# Patient Record
Sex: Male | Born: 1935 | Race: White | Hispanic: No | Marital: Married
Health system: Southern US, Community
[De-identification: ages and names within clinical notes are randomized; demographics above are authoritative.]

---

## 2004-11-30 ENCOUNTER — Inpatient Hospital Stay: Payer: Self-pay | Admitting: Internal Medicine

## 2005-04-16 ENCOUNTER — Ambulatory Visit: Payer: Self-pay | Admitting: Ophthalmology

## 2005-04-22 ENCOUNTER — Ambulatory Visit: Payer: Self-pay | Admitting: Ophthalmology

## 2005-05-21 ENCOUNTER — Ambulatory Visit: Payer: Self-pay | Admitting: Unknown Physician Specialty

## 2005-06-08 ENCOUNTER — Ambulatory Visit: Payer: Self-pay | Admitting: Ophthalmology

## 2005-06-24 ENCOUNTER — Ambulatory Visit: Payer: Self-pay | Admitting: Ophthalmology

## 2005-12-05 ENCOUNTER — Other Ambulatory Visit: Payer: Self-pay

## 2005-12-05 ENCOUNTER — Emergency Department: Payer: Self-pay | Admitting: Emergency Medicine

## 2006-05-27 ENCOUNTER — Ambulatory Visit: Payer: Self-pay | Admitting: Unknown Physician Specialty

## 2006-11-13 ENCOUNTER — Emergency Department: Payer: Self-pay | Admitting: Unknown Physician Specialty

## 2006-11-17 ENCOUNTER — Other Ambulatory Visit: Payer: Self-pay

## 2006-11-17 ENCOUNTER — Inpatient Hospital Stay: Payer: Self-pay | Admitting: Internal Medicine

## 2010-07-19 ENCOUNTER — Inpatient Hospital Stay: Payer: Self-pay | Admitting: Internal Medicine

## 2010-08-01 ENCOUNTER — Inpatient Hospital Stay: Payer: Self-pay | Admitting: Internal Medicine

## 2010-08-19 ENCOUNTER — Inpatient Hospital Stay: Payer: Self-pay | Admitting: *Deleted

## 2010-12-18 ENCOUNTER — Inpatient Hospital Stay: Payer: Self-pay | Admitting: Specialist

## 2011-04-01 IMAGING — CR DG CHEST 1V PORT
1 series · 1 of 1 positions shown · non-contrast
Comparison: none

REASON FOR EXAM: Shortness of Breath
COMMENTS:

[view not recorded]
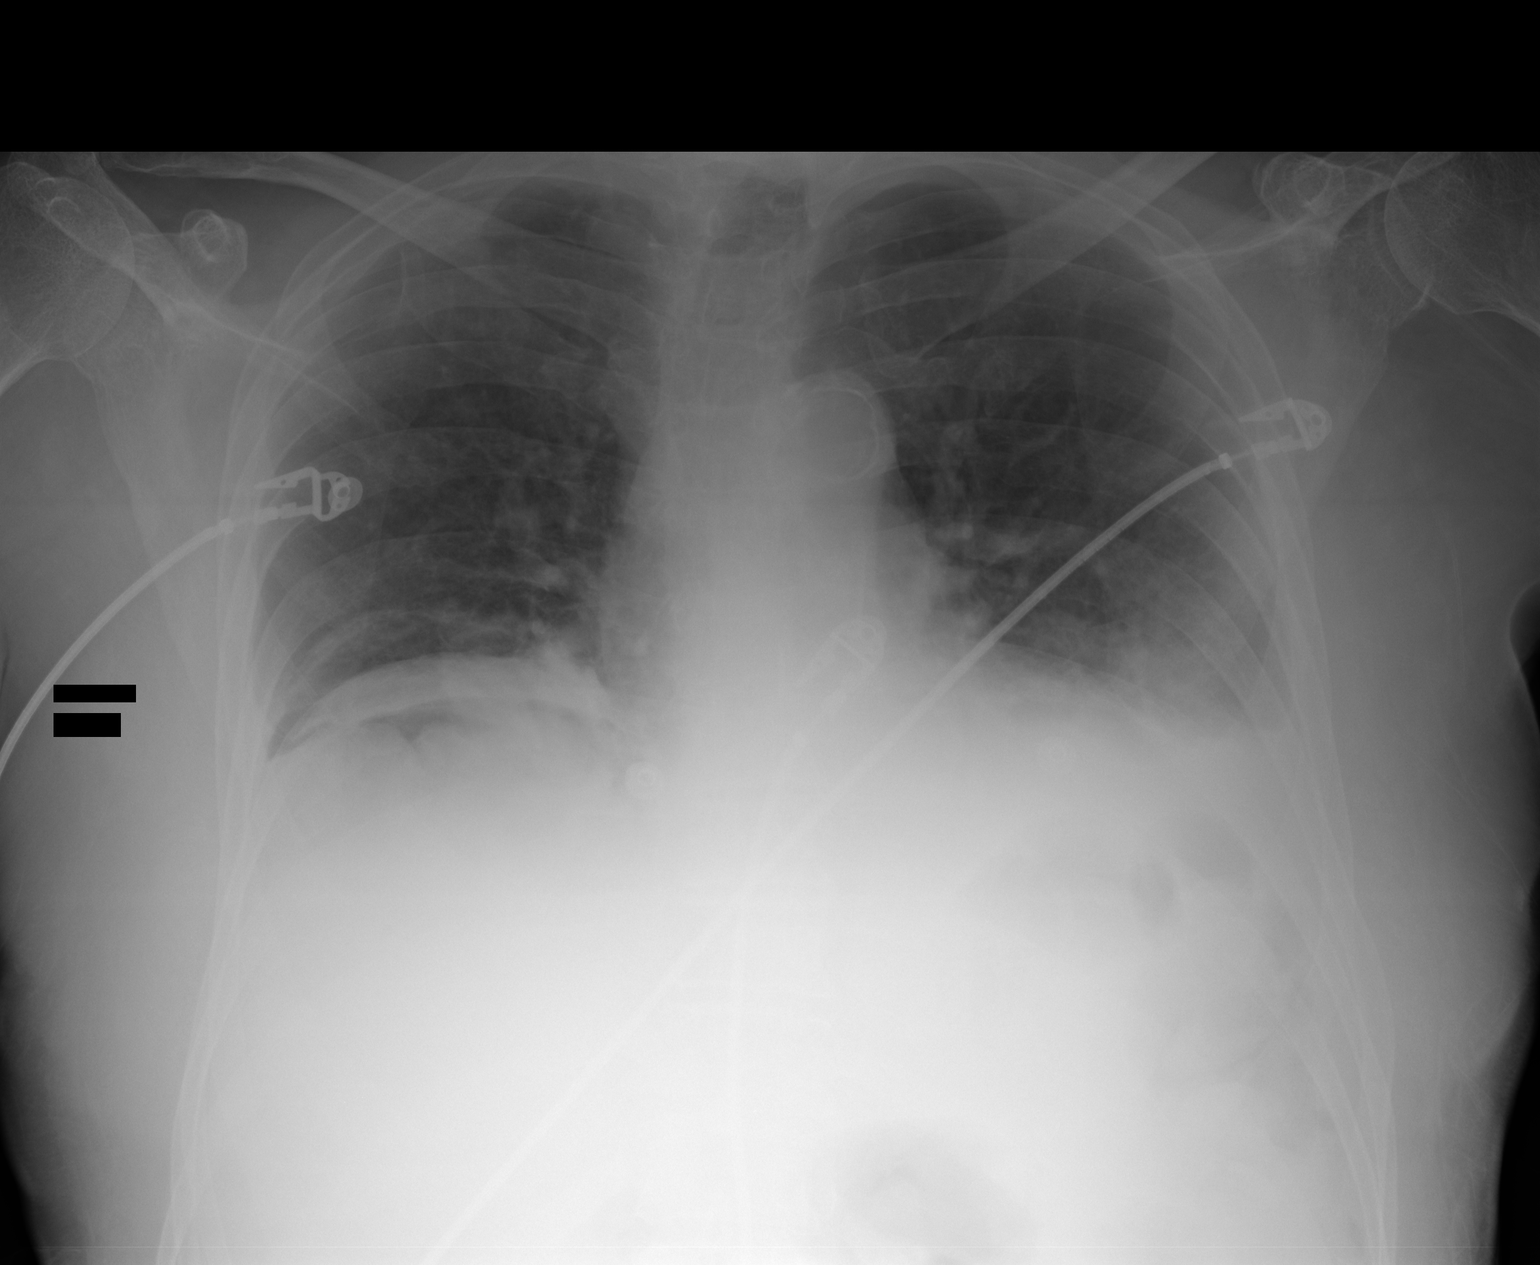

[1 of 1 positions shown; findings below may reference images not displayed]

PROCEDURE:     DXR - DXR PORTABLE CHEST SINGLE VIEW  - August 19, 2010  [DATE]

RESULT:     Portable AP view of the chest is compared to a prior exam of
08/04/2010.

There is observed increased density at the left base compatible with
pneumonia or atelectasis. Follow-up examination including PA and lateral
views is recommended when clinically feasible. The heart is upper limits for
normal in size. No pulmonary edema is seen.
IMPRESSION: There is increased density at the left base compatible with
atelectasis or pneumonia. Follow-up examination including PA and lateral
views is recommended.

## 2011-04-02 IMAGING — CR DG CHEST 2V
1 series · 2 of 2 positions shown · non-contrast
Comparison: none

REASON FOR EXAM: follow up chest film, admitted for increasing sob, chest
xr in er ? PNEUMONIA
COMMENTS:

[Series 1: view not recorded · 0.17mm/px · 2 of 2 slices shown]
[im 1/2]
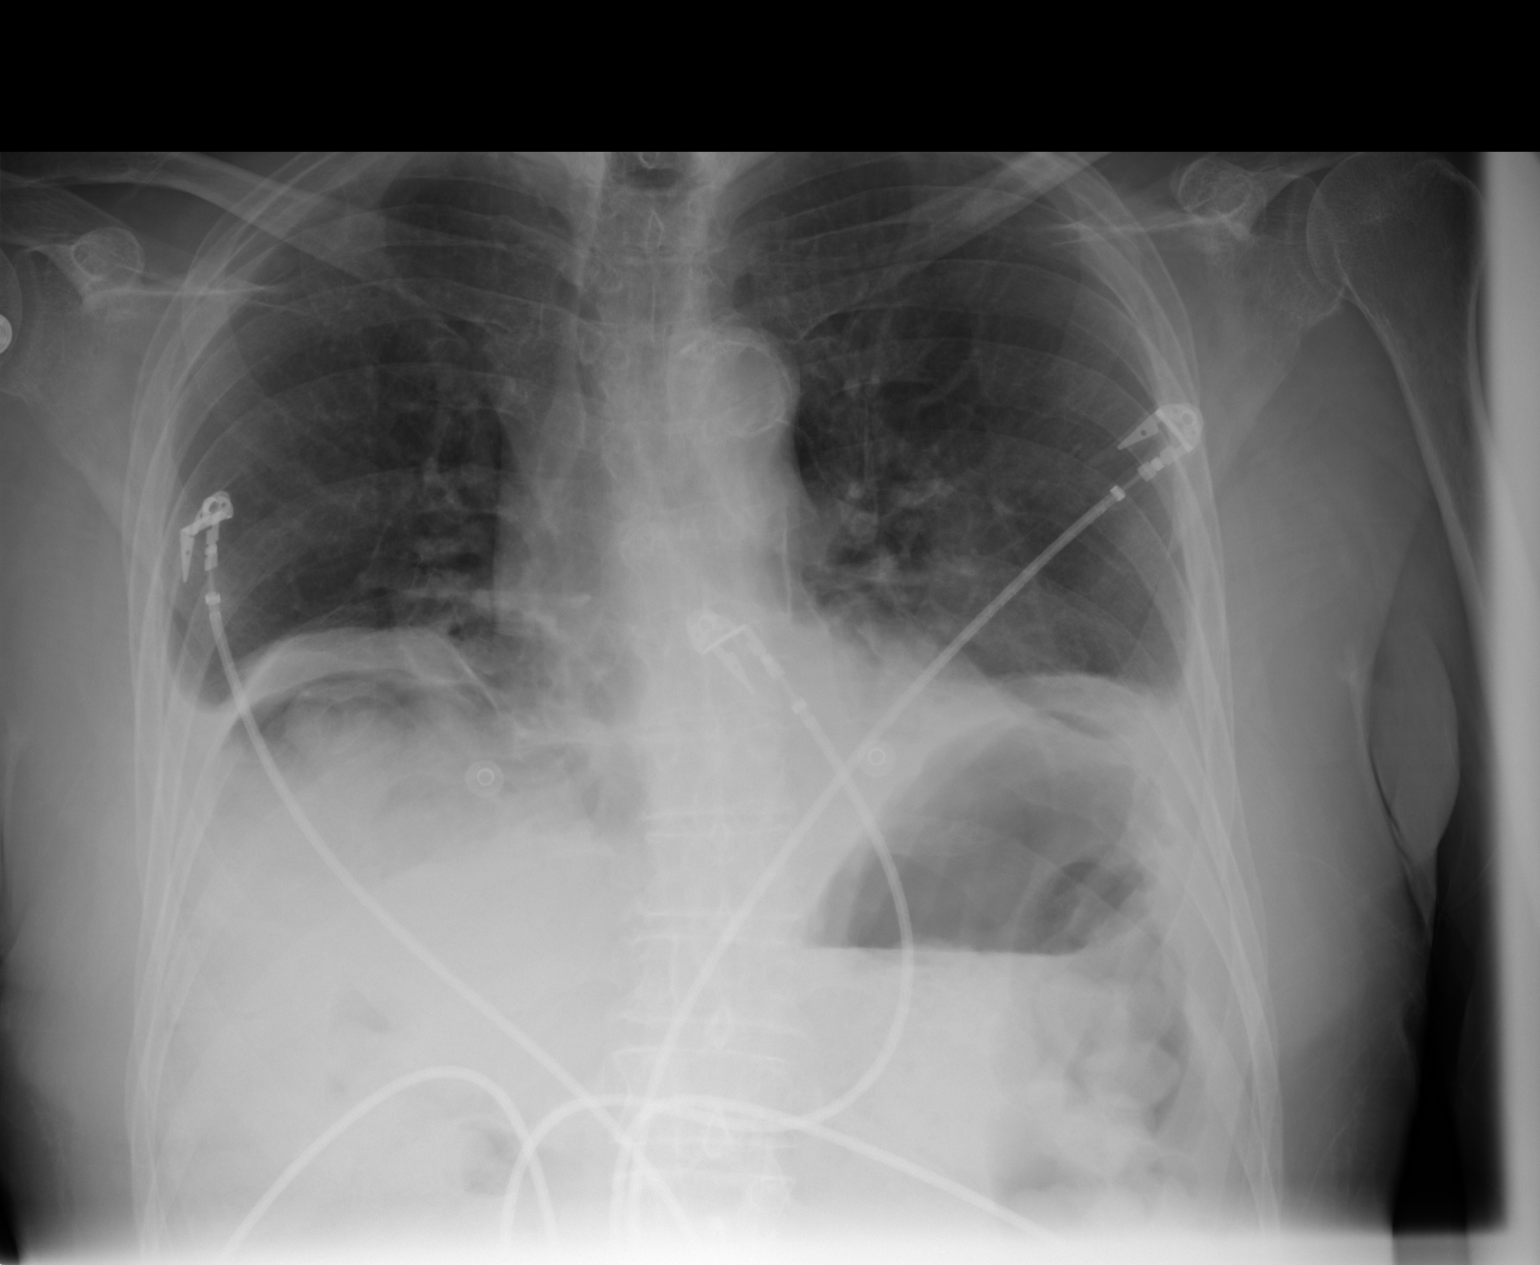
[im 2/2]
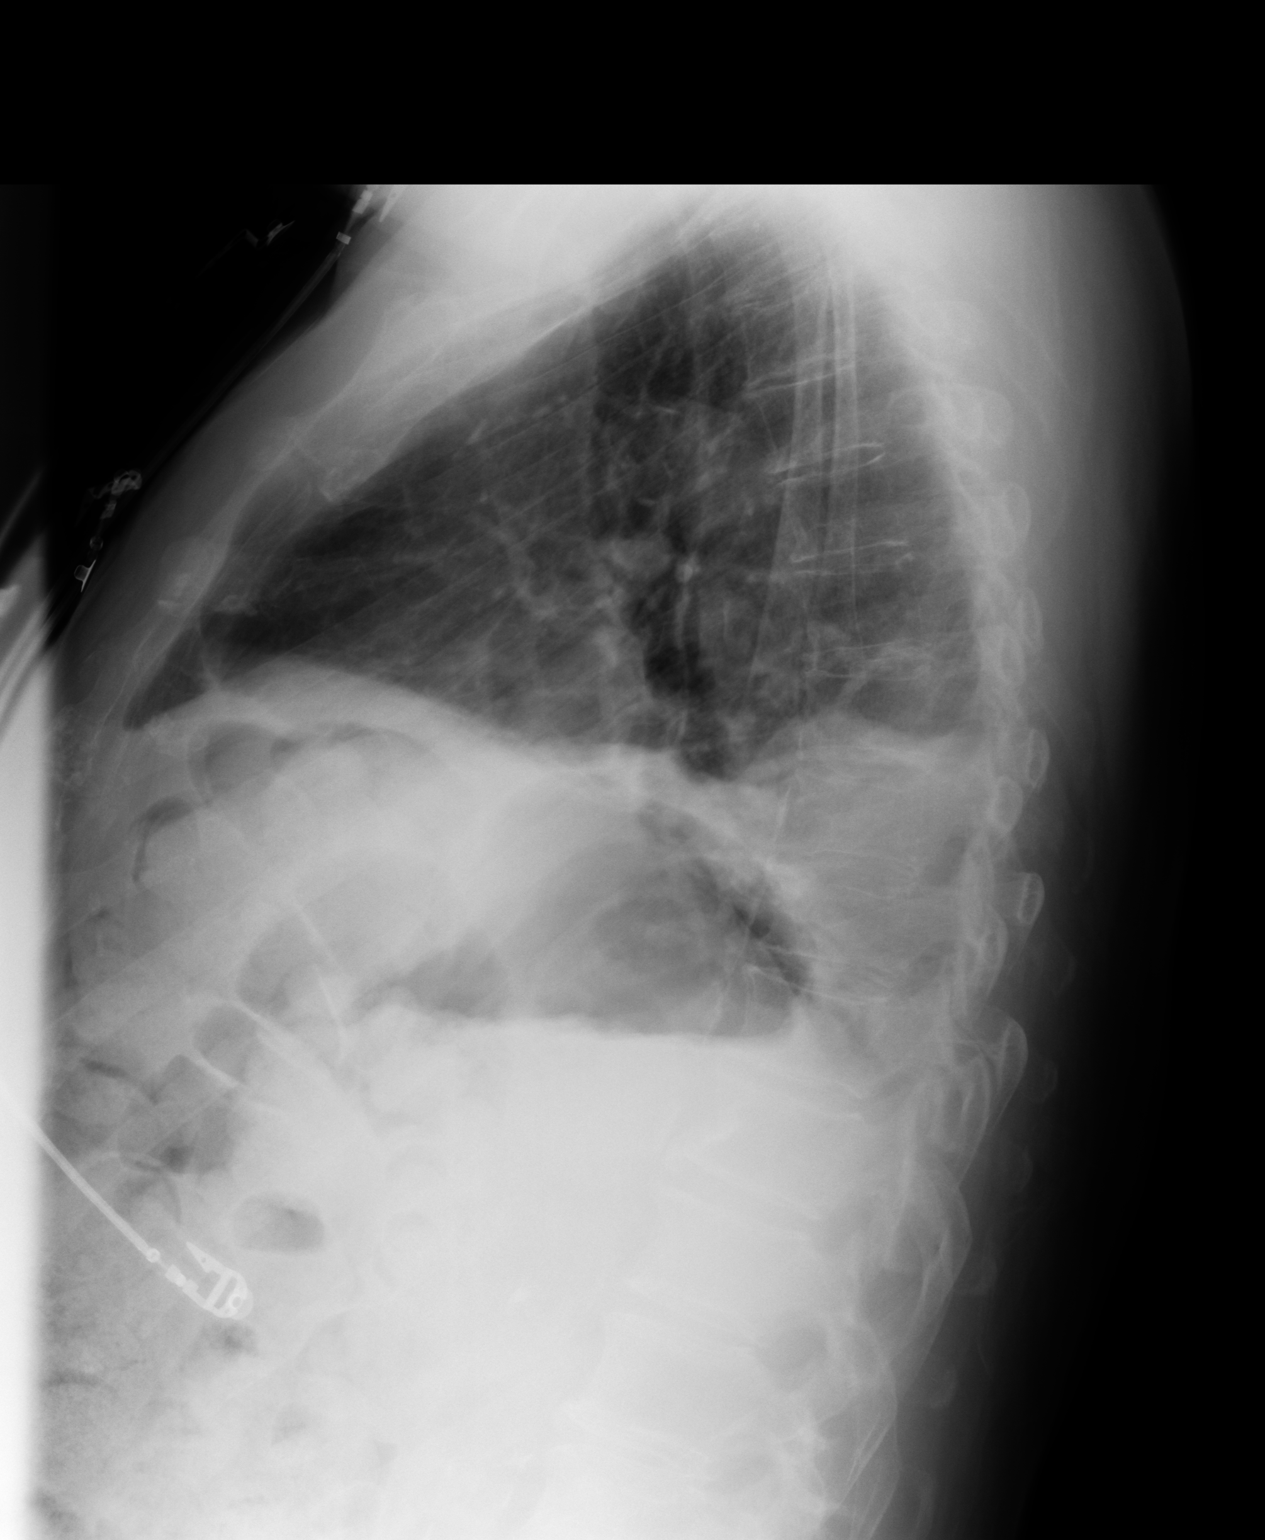

[2 of 2 positions shown; findings below may reference images not displayed]

PROCEDURE:     DXR - DXR CHEST PA (OR AP) AND LATERAL  - August 20, 2010  [DATE]

RESULT:     Comparison is made to the prior exam of 08/19/2010.

The current exam shows increased density posteriorly, apparently on the left
and compatible with pneumonia or effusion. The upper lobes are bilaterally
clear.
IMPRESSION: There is increased density posteriorly at the left base.
Pneumonia would be the first consideration but pleural effusion could
produce similar findings. Continued follow-up is recommended. Note is made
that the changes are best seen in the lateral view.

## 2011-04-19 ENCOUNTER — Inpatient Hospital Stay: Payer: Self-pay | Admitting: Internal Medicine

## 2011-05-16 ENCOUNTER — Inpatient Hospital Stay: Payer: Self-pay | Admitting: Internal Medicine

## 2011-10-13 ENCOUNTER — Inpatient Hospital Stay: Payer: Self-pay | Admitting: Internal Medicine

## 2011-10-13 LAB — CK TOTAL AND CKMB (NOT AT ARMC): CK, Total: 43 U/L (ref 35–232)

## 2011-10-13 LAB — COMPREHENSIVE METABOLIC PANEL
BUN: 60 mg/dL — ABNORMAL HIGH (ref 7–18)
Bilirubin,Total: 0.4 mg/dL (ref 0.2–1.0)
Chloride: 101 mmol/L (ref 98–107)
Creatinine: 3 mg/dL — ABNORMAL HIGH (ref 0.60–1.30)
EGFR (African American): 26 — ABNORMAL LOW
EGFR (Non-African Amer.): 21 — ABNORMAL LOW
Glucose: 136 mg/dL — ABNORMAL HIGH (ref 65–99)
Osmolality: 289 (ref 275–301)
SGPT (ALT): 25 U/L
Total Protein: 6.8 g/dL (ref 6.4–8.2)

## 2011-10-13 LAB — CBC
HGB: 12.1 g/dL — ABNORMAL LOW (ref 13.0–18.0)
MCH: 31.9 pg (ref 26.0–34.0)
RBC: 3.78 10*6/uL — ABNORMAL LOW (ref 4.40–5.90)
WBC: 12.4 10*3/uL — ABNORMAL HIGH (ref 3.8–10.6)

## 2011-10-13 LAB — APTT: Activated PTT: 37.1 secs — ABNORMAL HIGH (ref 23.6–35.9)

## 2011-10-13 LAB — PROTIME-INR: Prothrombin Time: 14.3 secs (ref 11.5–14.7)

## 2011-10-13 LAB — PRO B NATRIURETIC PEPTIDE: B-Type Natriuretic Peptide: 11080 pg/mL — ABNORMAL HIGH (ref 0–450)

## 2011-10-14 LAB — BASIC METABOLIC PANEL
Anion Gap: 10 (ref 7–16)
BUN: 68 mg/dL — ABNORMAL HIGH (ref 7–18)
Chloride: 99 mmol/L (ref 98–107)
Creatinine: 3.08 mg/dL — ABNORMAL HIGH (ref 0.60–1.30)
EGFR (African American): 25 — ABNORMAL LOW
EGFR (Non-African Amer.): 21 — ABNORMAL LOW
Osmolality: 301 (ref 275–301)
Potassium: 4.7 mmol/L (ref 3.5–5.1)
Sodium: 134 mmol/L — ABNORMAL LOW (ref 136–145)

## 2011-10-14 LAB — TROPONIN I
Troponin-I: <0.02 ng/mL
Troponin-I: 2.9 ng/mL — ABNORMAL HIGH

## 2011-10-14 LAB — CK TOTAL AND CKMB (NOT AT ARMC)
CK, Total: 38 U/L (ref 35–232)
CK-MB: 2.2 ng/mL (ref 0.5–3.6)

## 2011-10-14 LAB — APTT
Activated PTT: 36 secs — ABNORMAL HIGH (ref 23.6–35.9)
Activated PTT: >160 secs (ref 23.6–35.9)

## 2011-10-15 LAB — BASIC METABOLIC PANEL
Anion Gap: 11 (ref 7–16)
Calcium, Total: 8.6 mg/dL (ref 8.5–10.1)
Chloride: 98 mmol/L (ref 98–107)
Co2: 26 mmol/L (ref 21–32)
EGFR (African American): 26 — ABNORMAL LOW
EGFR (Non-African Amer.): 22 — ABNORMAL LOW
Glucose: 107 mg/dL — ABNORMAL HIGH (ref 65–99)
Sodium: 135 mmol/L — ABNORMAL LOW (ref 136–145)

## 2011-10-15 LAB — CBC WITH DIFFERENTIAL/PLATELET
Basophil #: 0 10*3/uL (ref 0.0–0.1)
Basophil %: 0.1 %
Eosinophil #: 0 10*3/uL (ref 0.0–0.7)
HCT: 33.8 % — ABNORMAL LOW (ref 40.0–52.0)
Lymphocyte %: 38.6 %
MCH: 31.8 pg (ref 26.0–34.0)
Monocyte %: 4.2 %
Neutrophil #: 5.4 10*3/uL (ref 1.4–6.5)
Neutrophil %: 56.8 %
Platelet: 141 10*3/uL — ABNORMAL LOW (ref 150–440)
RDW: 14.8 % — ABNORMAL HIGH (ref 11.5–14.5)
WBC: 9.5 10*3/uL (ref 3.8–10.6)

## 2011-10-15 LAB — APTT: Activated PTT: 109.6 secs — ABNORMAL HIGH (ref 23.6–35.9)

## 2011-10-16 LAB — BASIC METABOLIC PANEL
Anion Gap: 8 (ref 7–16)
Calcium, Total: 8.4 mg/dL — ABNORMAL LOW (ref 8.5–10.1)
Co2: 29 mmol/L (ref 21–32)
EGFR (African American): 27 — ABNORMAL LOW
EGFR (Non-African Amer.): 22 — ABNORMAL LOW
Glucose: 123 mg/dL — ABNORMAL HIGH (ref 65–99)
Osmolality: 290 (ref 275–301)
Potassium: 4 mmol/L (ref 3.5–5.1)

## 2011-10-16 LAB — URINALYSIS, COMPLETE
Bilirubin,UR: NEGATIVE
Glucose,UR: NEGATIVE mg/dL (ref 0–75)
Ketone: NEGATIVE
Leukocyte Esterase: NEGATIVE
Nitrite: NEGATIVE
Ph: 5 (ref 4.5–8.0)
RBC,UR: 1 /HPF (ref 0–5)
Squamous Epithelial: <1
WBC UR: 2 /HPF (ref 0–5)

## 2011-10-16 LAB — APTT: Activated PTT: 37.1 secs — ABNORMAL HIGH (ref 23.6–35.9)

## 2011-10-17 LAB — BASIC METABOLIC PANEL
Calcium, Total: 8.5 mg/dL (ref 8.5–10.1)
Chloride: 95 mmol/L — ABNORMAL LOW (ref 98–107)
EGFR (African American): 26 — ABNORMAL LOW
EGFR (Non-African Amer.): 22 — ABNORMAL LOW
Glucose: 181 mg/dL — ABNORMAL HIGH (ref 65–99)
Osmolality: 298 (ref 275–301)
Potassium: 4.3 mmol/L (ref 3.5–5.1)
Sodium: 134 mmol/L — ABNORMAL LOW (ref 136–145)

## 2011-10-19 LAB — CBC WITH DIFFERENTIAL/PLATELET
Basophil %: 0.1 %
Eosinophil %: 0.1 %
HCT: 37.9 % — ABNORMAL LOW (ref 40.0–52.0)
HGB: 12.2 g/dL — ABNORMAL LOW (ref 13.0–18.0)
Lymphocyte %: 40.4 %
MCH: 31.6 pg (ref 26.0–34.0)
Monocyte #: 0.4 10*3/uL (ref 0.0–0.7)
Monocyte %: 3.3 %
Neutrophil #: 7.7 10*3/uL — ABNORMAL HIGH (ref 1.4–6.5)
Neutrophil %: 56.1 %
Platelet: 190 10*3/uL (ref 150–440)
RBC: 3.88 10*6/uL — ABNORMAL LOW (ref 4.40–5.90)
WBC: 13.8 10*3/uL — ABNORMAL HIGH (ref 3.8–10.6)

## 2011-10-19 LAB — BASIC METABOLIC PANEL
BUN: 110 mg/dL — ABNORMAL HIGH (ref 7–18)
Calcium, Total: 8.9 mg/dL (ref 8.5–10.1)
Chloride: 96 mmol/L — ABNORMAL LOW (ref 98–107)
Co2: 32 mmol/L (ref 21–32)
Creatinine: 3.09 mg/dL — ABNORMAL HIGH (ref 0.60–1.30)
EGFR (African American): 25 — ABNORMAL LOW
EGFR (Non-African Amer.): 21 — ABNORMAL LOW
Glucose: 158 mg/dL — ABNORMAL HIGH (ref 65–99)
Sodium: 137 mmol/L (ref 136–145)

## 2011-10-20 LAB — BASIC METABOLIC PANEL
Anion Gap: 10 (ref 7–16)
Co2: 33 mmol/L — ABNORMAL HIGH (ref 21–32)
Creatinine: 3.18 mg/dL — ABNORMAL HIGH (ref 0.60–1.30)
EGFR (African American): 24 — ABNORMAL LOW
Osmolality: 315 (ref 275–301)
Sodium: 138 mmol/L (ref 136–145)

## 2011-10-21 LAB — BASIC METABOLIC PANEL
Anion Gap: 9 (ref 7–16)
BUN: 115 mg/dL — ABNORMAL HIGH (ref 7–18)
Calcium, Total: 9 mg/dL (ref 8.5–10.1)
Chloride: 96 mmol/L — ABNORMAL LOW (ref 98–107)
Creatinine: 2.79 mg/dL — ABNORMAL HIGH (ref 0.60–1.30)
EGFR (African American): 28 — ABNORMAL LOW
EGFR (Non-African Amer.): 23 — ABNORMAL LOW
Glucose: 119 mg/dL — ABNORMAL HIGH (ref 65–99)
Potassium: 4.1 mmol/L (ref 3.5–5.1)
Sodium: 138 mmol/L (ref 136–145)

## 2011-10-22 LAB — CREATININE, SERUM
Creatinine: 2.82 mg/dL — ABNORMAL HIGH
EGFR (African American): 28 — ABNORMAL LOW
EGFR (Non-African Amer.): 23 — ABNORMAL LOW

## 2011-12-27 IMAGING — CR DG CHEST 1V PORT
1 series · 1 of 1 positions shown · non-contrast
Comparison: none

REASON FOR EXAM: Fever, SOB
COMMENTS:

PROCEDURE:     DXR - DXR PORTABLE CHEST SINGLE VIEW  - May 16, 2011  [DATE]
RESULT:     Comparison: 12/21/2010

[view not recorded]
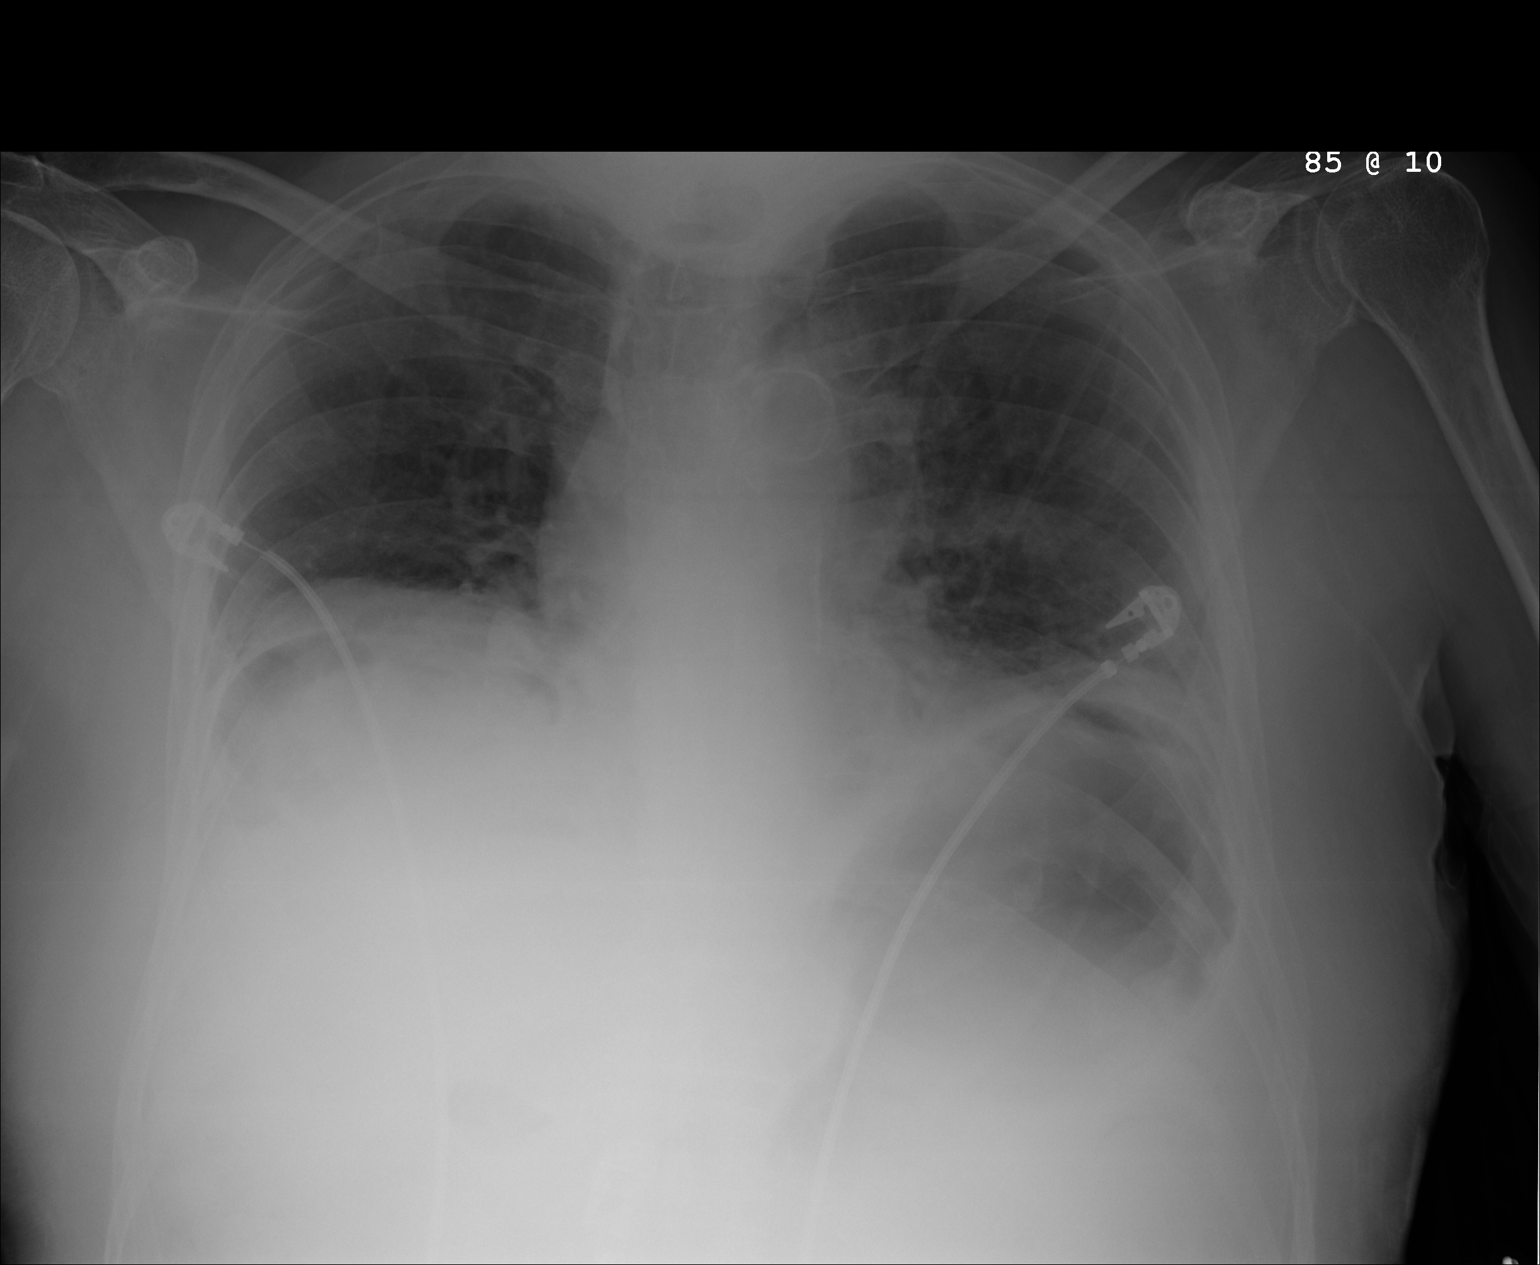

[1 of 1 positions shown; findings below may reference images not displayed]

FINDINGS: Heart and mediastinum are stable, given the low lung volumes. There are
bibasilar opacities. Lucency overlying the right hemidiaphragm is similar to
prior and felt to be secondary to the overlying colon. There is mild
prominence of the pulmonary interstitium.
IMPRESSION: 1. Bibasilar heterogeneous opacities may represent atelectasis secondary to
the low lung volumes. However, infection cannot be excluded.
2. Interstitial opacities may represent interstitial pulmonary edema.
Atypical infection could have a similar appearance.

## 2012-03-07 ENCOUNTER — Ambulatory Visit: Payer: Self-pay | Admitting: Internal Medicine

## 2012-04-01 ENCOUNTER — Inpatient Hospital Stay: Payer: Self-pay | Admitting: Internal Medicine

## 2012-04-01 LAB — COMPREHENSIVE METABOLIC PANEL
Albumin: 3.7 g/dL (ref 3.4–5.0)
Anion Gap: 12 (ref 7–16)
Calcium, Total: 8.7 mg/dL (ref 8.5–10.1)
Chloride: 108 mmol/L — ABNORMAL HIGH (ref 98–107)
EGFR (African American): 14 — ABNORMAL LOW
EGFR (Non-African Amer.): 12 — ABNORMAL LOW
Glucose: 72 mg/dL (ref 65–99)
Osmolality: 302 (ref 275–301)
Potassium: 4.4 mmol/L (ref 3.5–5.1)
SGOT(AST): 15 U/L (ref 15–37)
SGPT (ALT): 19 U/L
Sodium: 138 mmol/L (ref 136–145)

## 2012-04-01 LAB — URINALYSIS, COMPLETE
Bacteria: NONE SEEN
Bilirubin,UR: NEGATIVE
Glucose,UR: NEGATIVE mg/dL (ref 0–75)
Leukocyte Esterase: NEGATIVE
RBC,UR: <1 /HPF (ref 0–5)
Squamous Epithelial: NONE SEEN

## 2012-04-01 LAB — CBC WITH DIFFERENTIAL/PLATELET
Basophil %: 0.4 %
Eosinophil %: 1.9 %
HGB: 12.3 g/dL — ABNORMAL LOW (ref 13.0–18.0)
Lymphocyte #: 2.6 10*3/uL (ref 1.0–3.6)
Lymphocyte %: 22.1 %
MCV: 98 fL (ref 80–100)
Monocyte %: 7.3 %
Neutrophil %: 68.3 %
RBC: 3.84 10*6/uL — ABNORMAL LOW (ref 4.40–5.90)
RDW: 16.1 % — ABNORMAL HIGH (ref 11.5–14.5)

## 2012-04-01 LAB — PHOSPHORUS: Phosphorus: 7.5 mg/dL — ABNORMAL HIGH (ref 2.5–4.9)

## 2012-04-01 LAB — CK TOTAL AND CKMB (NOT AT ARMC)
CK, Total: 35 U/L (ref 35–232)
CK-MB: 4.4 ng/mL — ABNORMAL HIGH (ref 0.5–3.6)

## 2012-04-01 LAB — PRO B NATRIURETIC PEPTIDE: B-Type Natriuretic Peptide: 41354 pg/mL — ABNORMAL HIGH (ref 0–450)

## 2012-04-02 LAB — BASIC METABOLIC PANEL
Anion Gap: 16 (ref 7–16)
BUN: 95 mg/dL — ABNORMAL HIGH (ref 7–18)
Calcium, Total: 8.3 mg/dL — ABNORMAL LOW (ref 8.5–10.1)
Creatinine: 4.35 mg/dL — ABNORMAL HIGH (ref 0.60–1.30)
EGFR (African American): 13 — ABNORMAL LOW
EGFR (Non-African Amer.): 12 — ABNORMAL LOW
Glucose: 139 mg/dL — ABNORMAL HIGH (ref 65–99)
Osmolality: 309 (ref 275–301)
Potassium: 4.7 mmol/L (ref 3.5–5.1)

## 2012-04-02 LAB — CBC WITH DIFFERENTIAL/PLATELET
Basophil #: 0 10*3/uL (ref 0.0–0.1)
Eosinophil #: 0 10*3/uL (ref 0.0–0.7)
HCT: 34.9 % — ABNORMAL LOW (ref 40.0–52.0)
HGB: 11.3 g/dL — ABNORMAL LOW (ref 13.0–18.0)
Lymphocyte #: 2.3 10*3/uL (ref 1.0–3.6)
Lymphocyte %: 26.3 %
MCHC: 32.2 g/dL (ref 32.0–36.0)
Monocyte %: 1.6 %
Neutrophil #: 6.4 10*3/uL (ref 1.4–6.5)
Platelet: 119 10*3/uL — ABNORMAL LOW (ref 150–440)
RDW: 16 % — ABNORMAL HIGH (ref 11.5–14.5)
WBC: 8.9 10*3/uL (ref 3.8–10.6)

## 2012-04-02 LAB — PHOSPHORUS: Phosphorus: 8.1 mg/dL — ABNORMAL HIGH (ref 2.5–4.9)

## 2012-04-03 LAB — CBC WITH DIFFERENTIAL/PLATELET
Basophil #: 0.1 10*3/uL (ref 0.0–0.1)
Basophil %: 0.9 %
Eosinophil #: 0.1 10*3/uL (ref 0.0–0.7)
Lymphocyte %: 22.9 %
MCHC: 32.9 g/dL (ref 32.0–36.0)
Monocyte #: 0.8 x10 3/mm (ref 0.2–1.0)
Monocyte %: 7.8 %
Neutrophil %: 67.5 %
WBC: 10.8 10*3/uL — ABNORMAL HIGH (ref 3.8–10.6)

## 2012-04-03 LAB — PROTEIN / CREATININE RATIO, URINE
Creatinine, Urine: 41 mg/dL (ref 30.0–125.0)
Protein, Random Urine: 45 mg/dL — ABNORMAL HIGH (ref 0–12)
Protein/Creat. Ratio: 1098 mg/gCREAT — ABNORMAL HIGH (ref 0–200)

## 2012-04-03 LAB — BASIC METABOLIC PANEL
Anion Gap: 13 (ref 7–16)
BUN: 106 mg/dL — ABNORMAL HIGH (ref 7–18)
Creatinine: 4.61 mg/dL — ABNORMAL HIGH (ref 0.60–1.30)
EGFR (Non-African Amer.): 11 — ABNORMAL LOW
Glucose: 134 mg/dL — ABNORMAL HIGH (ref 65–99)
Osmolality: 307 (ref 275–301)

## 2012-04-04 LAB — CBC WITH DIFFERENTIAL/PLATELET
Basophil #: 0 10*3/uL (ref 0.0–0.1)
Basophil %: 0.1 %
Eosinophil #: 0.2 10*3/uL (ref 0.0–0.7)
HCT: 35.7 % — ABNORMAL LOW (ref 40.0–52.0)
HGB: 11.5 g/dL — ABNORMAL LOW (ref 13.0–18.0)
Lymphocyte #: 2.9 10*3/uL (ref 1.0–3.6)
MCH: 31.8 pg (ref 26.0–34.0)
MCHC: 32.4 g/dL (ref 32.0–36.0)
Monocyte #: 0.9 x10 3/mm (ref 0.2–1.0)
Neutrophil #: 9.8 10*3/uL — ABNORMAL HIGH (ref 1.4–6.5)
Neutrophil %: 70.7 %
RBC: 3.63 10*6/uL — ABNORMAL LOW (ref 4.40–5.90)
RDW: 16.4 % — ABNORMAL HIGH (ref 11.5–14.5)

## 2012-04-04 LAB — CREATININE CLEARANCE, URINE, 24 HOUR
Creatinine Clearance: 5 mL/min — ABNORMAL LOW (ref 80–130)
Creatinine, Serum: 4.68 mg/dL — ABNORMAL HIGH (ref 0.50–1.20)

## 2012-04-04 LAB — RENAL FUNCTION PANEL
Albumin: 3.3 g/dL — ABNORMAL LOW (ref 3.4–5.0)
Anion Gap: 16 (ref 7–16)
BUN: 106 mg/dL — ABNORMAL HIGH (ref 7–18)
Calcium, Total: 7.8 mg/dL — ABNORMAL LOW (ref 8.5–10.1)
Creatinine: 4.68 mg/dL — ABNORMAL HIGH (ref 0.60–1.30)
Glucose: 128 mg/dL — ABNORMAL HIGH (ref 65–99)
Osmolality: 307 (ref 275–301)
Sodium: 136 mmol/L (ref 136–145)

## 2012-04-05 LAB — BASIC METABOLIC PANEL
Anion Gap: 15 (ref 7–16)
BUN: 118 mg/dL — ABNORMAL HIGH (ref 7–18)
Chloride: 104 mmol/L (ref 98–107)
Creatinine: 5.22 mg/dL — ABNORMAL HIGH (ref 0.60–1.30)
EGFR (African American): 11 — ABNORMAL LOW
EGFR (Non-African Amer.): 9 — ABNORMAL LOW
Glucose: 108 mg/dL — ABNORMAL HIGH (ref 65–99)
Osmolality: 305 (ref 275–301)
Potassium: 5 mmol/L (ref 3.5–5.1)

## 2012-04-05 LAB — CBC WITH DIFFERENTIAL/PLATELET
Basophil #: 0 10*3/uL (ref 0.0–0.1)
Eosinophil #: 0.2 10*3/uL (ref 0.0–0.7)
Eosinophil %: 1.6 %
HGB: 11.3 g/dL — ABNORMAL LOW (ref 13.0–18.0)
Lymphocyte #: 2.2 10*3/uL (ref 1.0–3.6)
MCH: 32.6 pg (ref 26.0–34.0)
MCHC: 33.3 g/dL (ref 32.0–36.0)
MCV: 98 fL (ref 80–100)
Monocyte #: 0.9 x10 3/mm (ref 0.2–1.0)
Monocyte %: 7.7 %
Neutrophil %: 70.8 %
Platelet: 137 10*3/uL — ABNORMAL LOW (ref 150–440)
RBC: 3.47 10*6/uL — ABNORMAL LOW (ref 4.40–5.90)
RDW: 16.1 % — ABNORMAL HIGH (ref 11.5–14.5)

## 2012-04-05 LAB — UR PROT ELECTROPHORESIS, URINE RANDOM

## 2012-04-06 LAB — BASIC METABOLIC PANEL
Creatinine: 5.7 mg/dL — ABNORMAL HIGH (ref 0.60–1.30)
EGFR (African American): 10 — ABNORMAL LOW
EGFR (Non-African Amer.): 8 — ABNORMAL LOW
Glucose: 120 mg/dL — ABNORMAL HIGH (ref 65–99)
Potassium: 5.3 mmol/L — ABNORMAL HIGH (ref 3.5–5.1)
Sodium: 134 mmol/L — ABNORMAL LOW (ref 136–145)

## 2012-04-06 LAB — CBC WITH DIFFERENTIAL/PLATELET
Basophil #: 0 10*3/uL (ref 0.0–0.1)
Basophil %: 0.2 %
Eosinophil #: 0.1 10*3/uL (ref 0.0–0.7)
HCT: 34.6 % — ABNORMAL LOW (ref 40.0–52.0)
HGB: 11.3 g/dL — ABNORMAL LOW (ref 13.0–18.0)
Lymphocyte %: 23.7 %
MCH: 32.4 pg (ref 26.0–34.0)
MCHC: 32.7 g/dL (ref 32.0–36.0)
MCV: 99 fL (ref 80–100)
Neutrophil %: 67.3 %
Platelet: 128 10*3/uL — ABNORMAL LOW (ref 150–440)
RBC: 3.49 10*6/uL — ABNORMAL LOW (ref 4.40–5.90)

## 2012-04-07 ENCOUNTER — Ambulatory Visit: Payer: Self-pay | Admitting: Internal Medicine

## 2012-05-08 DEATH — deceased

## 2012-11-13 IMAGING — US US RENAL KIDNEY
1 series · 14 of 25 positions shown · non-contrast
Comparison: none

REASON FOR EXAM: acute renal failure, CKD stage IV
COMMENTS:

[Series 1: us renal kidney · 0.28mm/px · 14 of 45 slices shown]
[im 1/45]
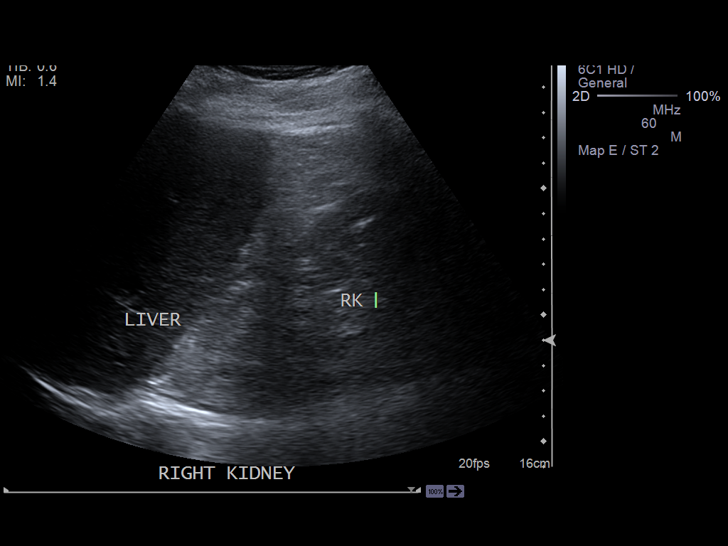
[im 4/45]
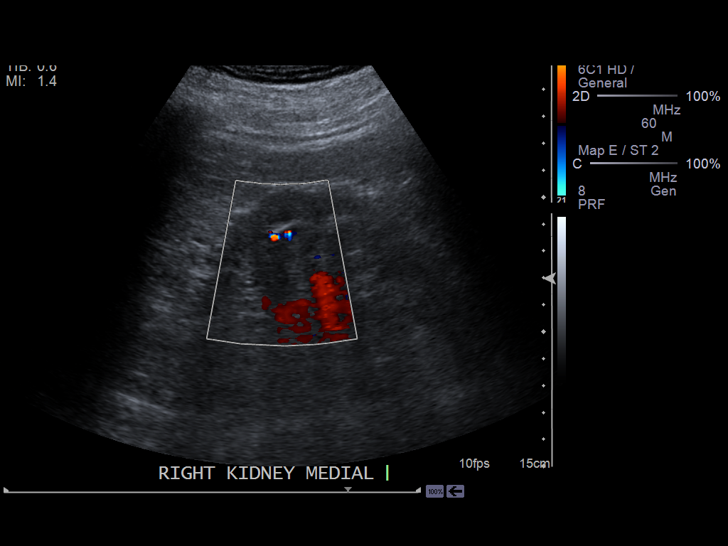
[im 8/45]
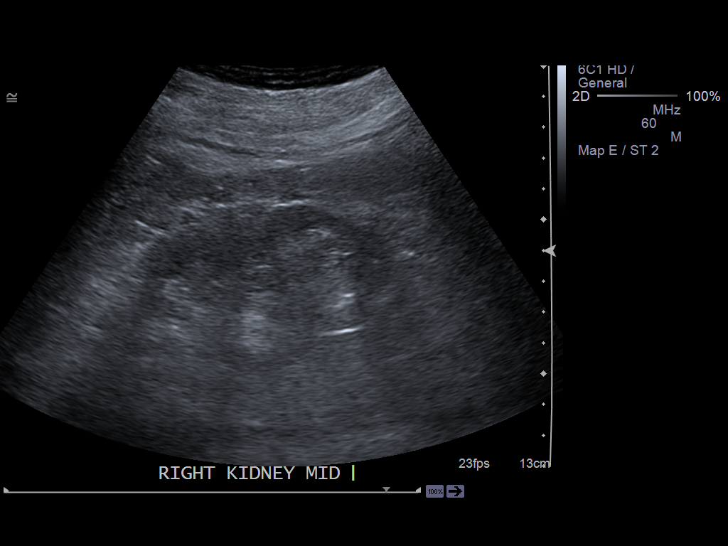
[im 12/45]
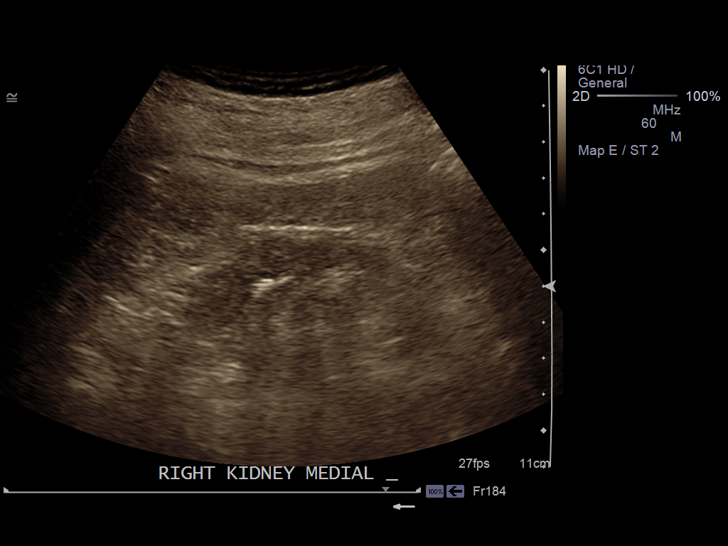
[im 15/45]
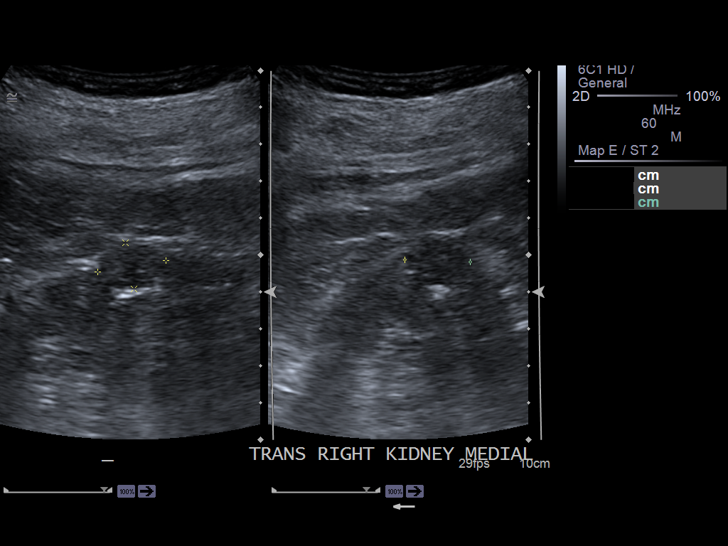
[im 17/45]
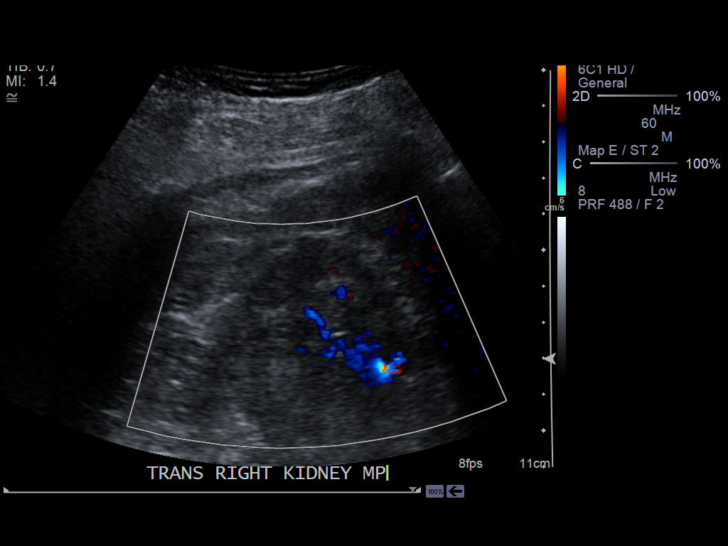
[im 21/45]
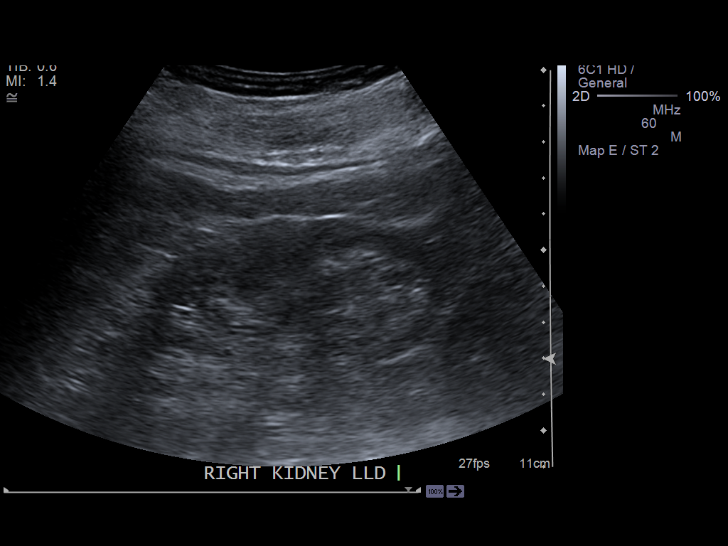
[im 24/45]
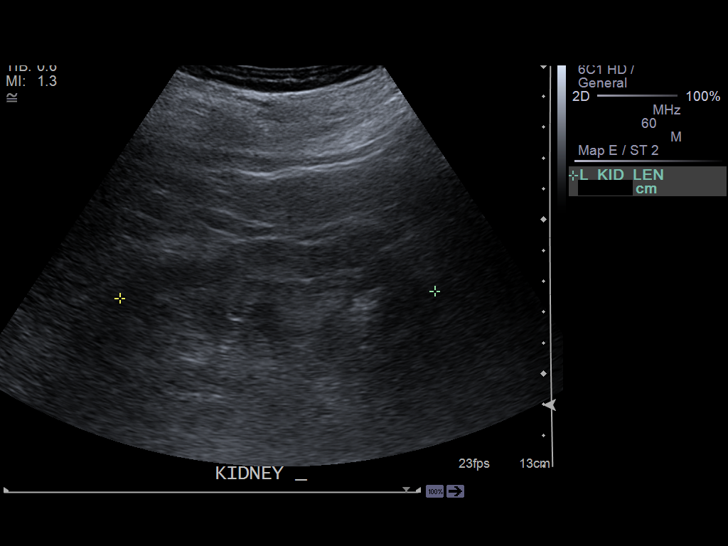
[im 28/45]
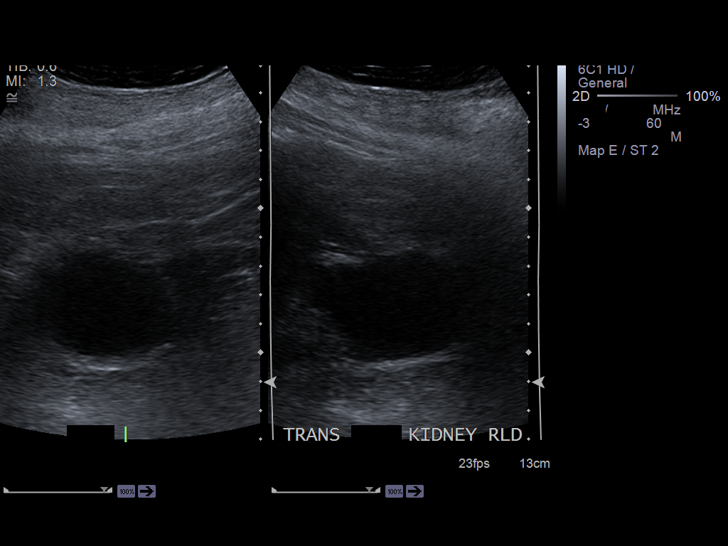
[im 30/45]
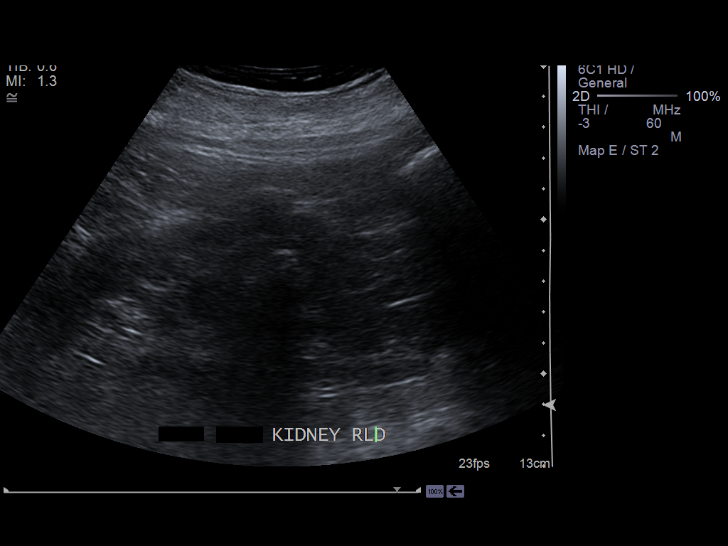
[im 34/45]
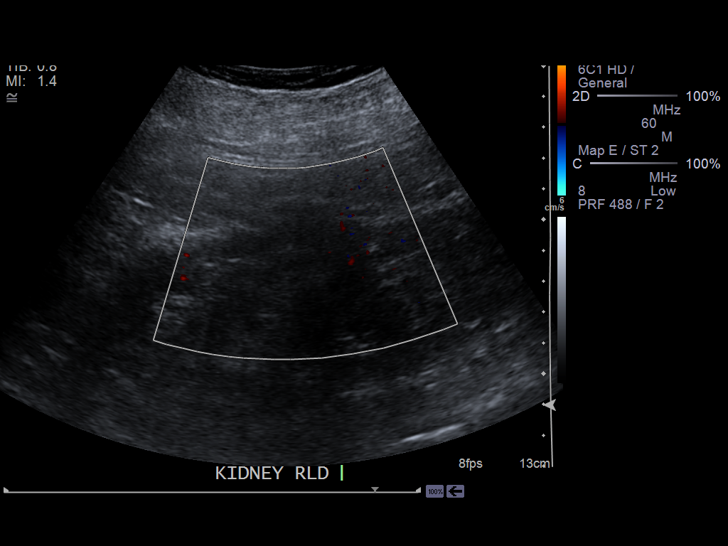
[im 37/45]
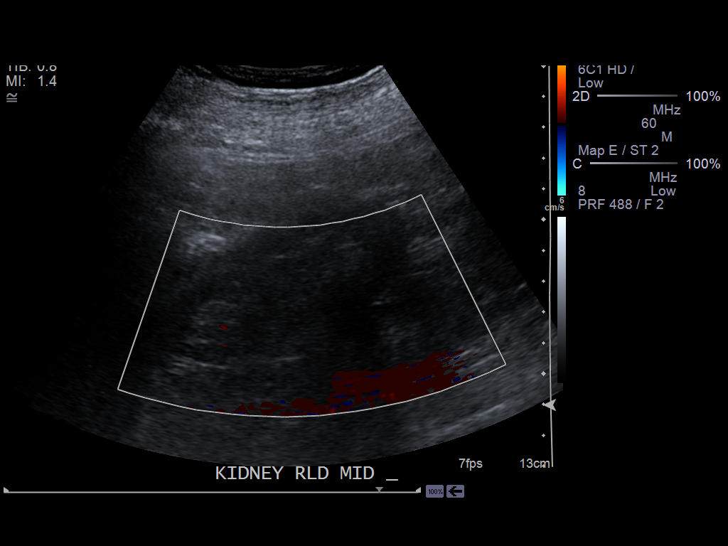
[im 41/45]
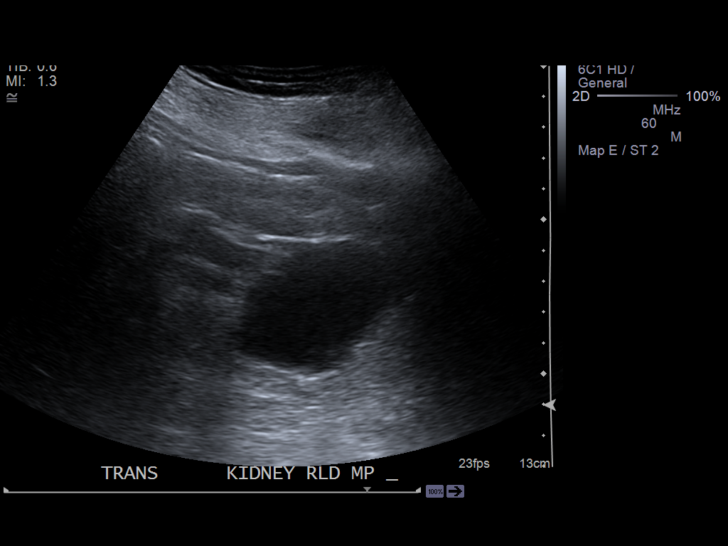
[im 45/45]
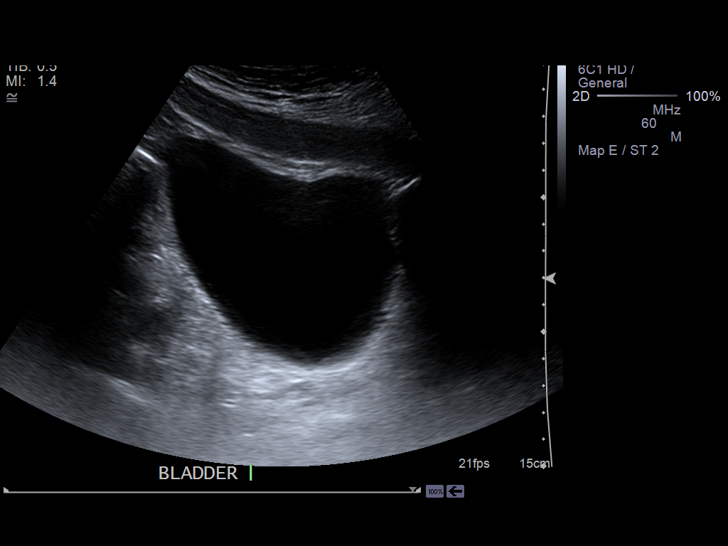

[14 of 25 positions shown; findings below may reference images not displayed]

PROCEDURE:     US  - US KIDNEY  - April 02, 2012  [DATE]

RESULT:     The right kidney measures 9.5 x 5.6 x 4.6 cm. The left kidney
measures 10.2 x 4.4 x 6.6 cm. Neither kidney exhibits evidence of
obstruction. The echotexture of the renal cortex on the right is equal to
that of the adjacent liver consistent with medical renal disease. The
echotexture is heterogeneous in both kidneys. There is likely a stone in the
mid upper pole of the right kidney. A cyst in the left kidney measures 5. 6
cm in greatest dimension. The urinary bladder is partially distended.
Ureteral jets are not demonstrated.
IMPRESSION: 1. The kidneys exhibit no evidence of obstruction. A nonobstructing upper
pole stone on the right is present.
2. The echotexture of the renal cortex is increased bilaterally consistent
with medical renal disease. There is heterogeneity of the echotexture
diffusely in both kidneys and masses cannot be absolutely excluded.
Comparison with the CT scan dated 17 December, 2010 reveals the hydronephrosis
on the left been relieved. The mid pole cyst is unchanged.

## 2014-12-25 NOTE — H&P (Signed)
PATIENT NAME:  Hayden Cisneros, Hayden Cisneros MR#:  [id number] DATE OF BIRTH:  08-05-1926  DATE OF ADMISSION:  04/01/2012  PRIMARY CARE PHYSICIAN: Bethann PunchesMark Miller, MD   CHIEF COMPLAINT: Shortness of breath this morning.   HISTORY OF PRESENT ILLNESS: The patient is an 81 year old Caucasian male with a history of hypertension, diabetes, diastolic congestive heart failure, COPD, and CKD who presented to the ED with shortness of breath. The patient was sent from nursing home due to shortness of breath this morning. The patient is alert, awake, and oriented. He said he developed right shoulder pain for a short period of time this morning and then had shortness of breath and cough which became worse. In addition, he has had leg edema for the past three days but he does not know whether he gained or lost weight. He also complains of orthopnea but no nocturnal dyspnea. The patient denies any fever or chills. No headache or dizziness. Denies any other symptoms. The patient's uses wheelchair in the nursing home.   PAST MEDICAL HISTORY:  1. Hypertension.  2. Diabetes.  3. Diastolic congestive heart failure. 4. Chronic obstructive pulmonary disease. 5. CKD. 6. Hyperlipidemia. 7. Glaucoma. 8. Benign prostatic hypertrophy.  9. Gastroesophageal reflux disease.  10. Atrial fibrillation, previously on Pradaxa and now on aspirin.  11. Ischemic colitis presumed with GI bleeding in August 2012.   SOCIAL HISTORY: The patient has smoked a pipe for about 60 years. Denies any alcohol drinking or illicit drugs. Living in a nursing home.   FAMILY HISTORY: Father had a CVA.   REVIEW OF SYSTEMS: CONSTITUTIONAL: The patient denies any fever or chills. No headache or dizziness. No weight loss or weight gain but has fatigue. EYES: No double vision or blurred vision but has glaucoma. ENT: No postnasal drip, epistaxis, dysphagia, or slurred speech. RESPIRATORY: Positive for shortness of breath, cough but no wheezing or hemoptysis. No sputum.  CARDIOVASCULAR: No chest pain, palpitation but has orthopnea. No nocturnal dyspnea. Has leg edema. GI: No abdominal pain but has mild nausea. No vomiting or diarrhea. No melena or bloody stool. GU: No dysuria, hematuria, or incontinence. ENDOCRINE: No polyuria, polydipsia, heat or cold intolerance. HEMATOLOGIC: No easy bruising or bleeding. NEUROLOGY: No syncope, loss of consciousness, or seizure.   ALLERGIES: Simvastatin and statins.   HOME MEDICATIONS: Please refer to the medication reconciliation list.    PHYSICAL EXAMINATION:   VITAL SIGNS: Temperature 97.9, blood pressure 123/72, pulse 105, respirations 20, oxygen saturation 96%   GENERAL: The patient is alert, awake, oriented in no acute distress.   HEENT: Pupils are round, about 5 mm. No reaction to light. Moist oral mucosa. Clear oropharynx.   NECK: Supple. No JVD or carotid bruits. No lymphadenopathy. No thyromegaly.   CARDIOVASCULAR: S1, S2 irregular rate and rhythm. No murmurs or gallops.   PULMONARY: Bilateral air entry. Bilateral basilar rales. No wheezing. No crackles.   ABDOMEN: Soft. No distention or tenderness. No organomegaly. Bowel sounds present.   EXTREMITIES: Bilateral leg edema 2+. No clubbing or cyanosis. No calf tenderness. Bilateral strong pedal pulses.   SKIN: No rash or jaundice.   NEUROLOGY: Alert and oriented x3. No focal deficit but has bilateral leg weakness. Power 4 out of 5. Sensation intact. Deep tendon reflexes mute.   LABORATORY DATA: ABG showed pH 7.15, pCO2 38, pO2 75, bicarb 13.2, and base excess -14.9. CK 35. CK-MB 4.4. WBC 12, hemoglobin 12.3, platelets 147, glucose 72, BUN 90, creatinine 4.23. Electrolytes are normal. INR 1. Troponin less than 0.02.  BNP 41,354. Accu-Chek 61.   Chest x-ray showed bilateral pulmonary edema and possible pleural effusion bilateral. Need to follow-up official report.   EKG showed atrial fibrillation with PVCs at 93 beats per minute.   IMPRESSION:  1. Congestive  heart failure decompensation, acute on chronic with diastolic dysfunction.  2. Hypoxia.  3. Fluid overload.  4. Metabolic acidosis.  5. Acute renal failure on chronic kidney disease.  6. Leukocytosis.  7. Atrial fibrillation.   8. Hypertension.  9. Diabetes.  10. Anemia.  11. Thrombocytopenia.  12. Tobacco use.  13. History of benign prostatic hypertrophy. 14. Gastroesophageal reflux disease.   15. Osteoporosis. 16. Chronic obstructive pulmonary disease. 17. Glaucoma. 18. Cataracts.   PLAN OF TREATMENT:  1. The patient will be admitted to tele floor. We will continue telemonitor, O2 by nasal cannula.  2. We will start CHF protocol. 3. Daily weights. 4. Strict input and output measurement. 5. Fluid restriction.  6. We will increase Lasix to 40 mg IV Cisneros.i.d. but hold spironolactone due to acute renal failure.  7. Continue nebulizers, Spiriva, Advair, and DuoNebs p.r.n.  8. Acute renal failure on chronic kidney disease. We will get Nephrology consult and follow-up BMP.  9. For diabetes, since the patient's blood sugar is on the lower side, we will hold p.o. diabetes medications including glipizide and Tradjenta and start sliding scale.  10. For leukocytosis, possible due to reaction. We will follow-up CBC. So far no evidence of infection.  11. GI and DVT prophylaxis.   Discussed the patient's situation and the plan of treatment with the patient.   TIME SPENT: About 65 minutes.  ____________________________ Shaune Pollack, MD qc:drc D: 04/01/2012 10:34:00 ET T: 04/01/2012 10:56:42 ET JOB#: [id number]  cc: Shaune Pollack, MD, <Dictator> Danella Penton, MD Shaune Pollack MD ELECTRONICALLY SIGNED 04/01/2012 22:16

## 2014-12-25 NOTE — Consult Note (Signed)
PATIENT NAME:  Hayden Cisneros, Hayden Cisneros MR#:  [id number] DATE OF BIRTH:  07-Apr-1926  DATE OF CONSULTATION:  04/03/2012  CONSULTING PHYSICIAN:  Rhona RaiderMatthew G. Jenisha Faison, DPM  REASON FOR CONSULTATION: He is an 81 year old male patient known to me. I have treated him for diabetic-related foot issues, most recently for a decubitus-type ulcer on his right heel that was small but has been very slow to progress. He is admitted to the hospital because of problems with shortness of breath related to his multiple medical problems.   PAST MEDICAL HISTORY:  1. Hypertension.  2. Diabetes. 3. Diastolic congestive heart failure. 4. Chronic obstructive pulmonary disease. 5. Chronic kidney disease. 6. Glaucoma.  7. Hyperlipidemia.  8. Benign prostatic hypertrophy. 9. Gastroesophageal reflux.  10. Atrial fibrillation and is taking aspirin for that.  11. Ischemic colitis with an episode of GI bleeding in 2012.   SOCIAL HISTORY: He has been a pipe smoker for 60 years. Denies any EtOH or other issues. He is at a nursing home right now and is unable stay by himself.   ALLERGIES: Simvastatin and other statins.   CURRENT MEDICATIONS: Acetaminophen/hydrocodone, aspirin, Brimonidine, docusate, insulin sliding scale, Latanoprost, lorazepam, metoprolol, omeprazole, tiotropium inhaler and fluticasone/salmeterol inhaler.   PHYSICAL EXAMINATION:  VITAL SIGNS: Temperature 97.6, pulse 83, respirations 16, blood pressure 113/77, pulse oximetry 100%.   LOWER EXTREMITY EXAM: DP pulses are nonpalpable. I could not feel either a DP or a PT pulse, although the color to his feet is fairly good, maybe a little bit of cyanosis, but it is minimal. Capillary refill times are slow on the right foot.   DERMATOLOGIC: He has an ulceration on the posterior right heel. It is approximately 1.5 cm in diameter with a fairly superficial appearance. He does have a little tissue damage, more so than I saw it last time in the office. I  will go ahead and  debride that today with a 15 blade and get that cleaned up. He is wearing heel protector boots and has a good padded dressing on the heel at this juncture.   CLINICAL IMPRESSION: Nonhealing heel ulcer, right foot. I would like to get Vascular to check  him out so I can see if he has an opportunity to be able to heal this.   TREATMENT PLAN: I am going to debride the wound today and pad it and dress it as noted. I will consult Vascular tomorrow so they can take a look at him and see if there is anything perhaps they can do to improve blood flow to the region. I will follow him again in the next couple of days to make sure he is doing well but give  Vascular a chance to see him.   ____________________________ Rhona RaiderMatthew G. Amylynn Fano, DPM mgt:cbb D: 04/03/2012 14:50:24 ET T: 04/03/2012 15:20:17 ET JOB#: [id number]  cc: Rhona RaiderMatthew G. Nomi Rudnicki, DPM, <Dictator> Epimenio SarinMATTHEW G Corley Maffeo MD ELECTRONICALLY SIGNED 04/04/2012 13:16

## 2014-12-25 NOTE — Op Note (Signed)
PATIENT NAME:  Hayden Cisneros, Hayden Cisneros MR#:  [id number] DATE OF BIRTH:  [birth year]  DATE OF PROCEDURE:  04/03/2012  PREOPERATIVE DIAGNOSIS: Ulceration right heel.   POSTOPERATIVE DIAGNOSIS: Ulceration right heel.   PROCEDURE: Excisional debridement of ulceration right heel.   SURGEON: Rhona RaiderMatthew G. Aprille Sawhney, DPM  ASSISTANT: No assistant  HISTORY OF PRESENT ILLNESS: Patient has had a wound develop on the right heel. It has been present for several weeks, has remained fairly stable but has a little bit of dead tissue around it at this timeframe. He is in the hospital because of other medical issues including shortness of breath and his diabetes, chronic congestive heart failure, chronic kidney disease and ischemic colitis.   ANESTHESIA: None.   ESTIMATED BLOOD LOSS: None.   DESCRIPTION OF PROCEDURE: Patient was worked on at bedside in his hospital room. After a Betadine prep I debrided the dead tissue from the ulceration utilizing a #15 blade and some pick-ups to help initially to remove it. I asked for assistance from the nurse to hold his heel up so I could get this accomplished. The wound was debrided. Fairly good tissue was seen once I removed the dead tissue centrally and he tolerated this well. The area was then dressed with sterile compressive dressing with lots of padding. He is to continue wearing his protective boots to keep this stabilized. I am going to order some Santyl ointments wet to dry saline on this area and will continue to follow him. He is likely going to be transferred to hospice.    ____________________________ Rhona RaiderMatthew G. Regnald Bowens, DPM mgt:cms D: 04/20/2012 09:16:34 ET T: 04/20/2012 11:11:17 ET  JOB#: [id number] cc: Rhona RaiderMatthew G. Derreck Wiltsey, DPM, <Dictator> Epimenio SarinMATTHEW G Candace Begue MD ELECTRONICALLY SIGNED 05/24/2012 12:42

## 2014-12-25 NOTE — Consult Note (Signed)
General Aspect ASO with ulceration of the right heel    Present Illness The patient is an 81 year old male with diabetic-related foot issues.  In February he developed a decubitus-type ulcer on his right heel.  Initially it was doing well but of late it has increased in size.  He is also complaining of more pain in the right foot. He is admitted to the hospital because of problems with shortness of breath related to his multiple medical problems. He is also found to be in renal failure and the family is having discussions regarding begining dialysis  PAST MEDICAL HISTORY:  1. Hypertension.  2. Diabetes. 3. Diastolic congestive heart failure. 4. Chronic obstructive pulmonary disease. 5. Chronic kidney disease. 6. Glaucoma.  7. Hyperlipidemia.  8. Benign prostatic hypertrophy. 9. Gastroesophageal reflux.  10. Atrial fibrillation and is taking aspirin for that.  11. Ischemic colitis with an episode of GI bleeding in 2012.   Home Medications: Medication Instructions Status  Welchol 625 mg oral tablet 3 tab(s) orally 2 times a day (9 am, 5 pm) Active  Calcium 600+D 600 mg-200 intl units oral tablet 1 tab(s) orally 2 times a day (9 am, 5 pm) Active  Toprol-XL 50 mg oral tablet, extended release 1 tab(s) orally 2 times a day (8 am, 8 pm) **do not crush** Active  Senna S 50 mg-8.6 mg oral tablet 2 tab(s) orally 2 times a day for constipation (8 am, 8 pm) Active  Colace 100 mg oral capsule 1 cap(s) orally 2 times a day (9 am, 9 pm) Active  Advair Diskus 250 mcg-50 mcg inhalation powder 1 puff(s) inhaled 2 times a day (9 am, 9 pm) **rinse mouth after each use** Active  Alphagan P 0.15% ophthalmic solution 1 drop(s) to each eye 2 times a day (9 am, 9 pm)  Active  PriLOSEC OTC 20 mg oral delayed release tablet 1 tab(s) orally once a day (6 am) **do not crush** Active  Norco 5 mg-325 mg oral tablet 1 tab(s) orally once a day (6 am) **no more than 3000 mg apap/day from all sources** Active  Tandem  162 mg-115.2 mg oral capsule 1 cap(s) orally once a day (8 am) Active  glipiZIDE 5 mg oral tablet 0.5 tab(s) (2.5 mg) orally once a day (in the morning) (8 am) Active  Lasix 40 mg oral tablet 1 tab(s) orally once a day (9 am) Active  spironolactone 25 mg oral tablet 1 tab(s) orally once a day (9 am) Active  aspirin 81 mg oral tablet, chewable 1 tab(s) orally once a day (9 am) Active  Spiriva 18 mcg inhalation capsule 1 each inhaled once a day (9 am) Active  Tradjenta 5 mg oral tablet 1 tab(s) orally once a day (5 pm) Active  Xalatan 0.005% ophthalmic solution 1 drop(s) to each eye once a day (at bedtime) (8 pm) Active  MiraLax oral powder for reconstitution 17 gram(s) into 8 ounces of water and drinkorally once a day (in the evening) (8 pm) Active  Norco 5 mg-325 mg oral tablet 2 tab(s) orally once a day (at bedtime) for increased pain (8 pm) **no more than 3000 apap/day from all sources) Active  Ativan 0.5 mg oral tablet 1 tab(s) orally once a day (at bedtime) (9 pm) Active  DuoNeb 0.5 mg-2.5 mg/3 mL inhalation solution 3 milliliter(s) inhaled every 6 hours, As Needed- for Shortness of Breath/wheezing Active  Artificial Tears ophthalmic solution 1 drop(s) to each eye 2 times a day, As Needed for  dry eyes Active  Norco 5 mg-325 mg oral tablet 1 tab(s) orally 2 times a day, As Needed- for Pain (no more than 3000 mg apap/day from all sources) Active    Simvastatin: Unknown  Statins: Unknown  Case History:   Family History Non-Contributory    Social History negative tobacco, negative ETOH, negative Illicit drugs   Review of Systems:   Fever/Chills No    Cough No    Sputum No    Abdominal Pain No    Diarrhea No    Constipation No    Nausea/Vomiting No    SOB/DOE Yes    Chest Pain No    Telemetry Reviewed NSR    Dysuria No    Medications/Allergies Reviewed Medications/Allergies reviewed   Physical Exam:   GEN well developed, no acute distress    HEENT pink  conjunctivae, PERRL, moist oral mucosa    NECK supple  trachea midline    RESP normal resp effort  no use of accessory muscles    CARD regular rate  No LE edema  no JVD    ABD denies tenderness  soft    EXTR negative cyanosis/clubbing, negative edema, popliteal, DP and PT pulses are nonpalpable bilaterally    SKIN positive ulcers, skin turgor poor, right heel decub clean and not infected    NEURO follows commands, motor/sensory function intact    PSYCH alert, good insight   Nursing/Ancillary Notes: **Vital Signs.:   29-Jul-13 08:00   Vital Signs Type Routine   Temperature Temperature (F) 97.4   Celsius 36.3   Temperature Source oral   Pulse Pulse 92   Respirations Respirations 18   Systolic BP Systolic BP 732   Diastolic BP (mmHg) Diastolic BP (mmHg) 76   Mean BP 90   Pulse Ox % Pulse Ox % 96   Pulse Ox Activity Level  At rest   Oxygen Delivery 2L   Hepatic:  29-Jul-13 05:42    Albumin, Serum  3.3  Routine Chem:  29-Jul-13 05:42    Glucose, Serum  128   BUN  106   Creatinine (comp)  4.68   Sodium, Serum 136   Potassium, Serum 4.5   Chloride, Serum 104   CO2, Serum  16   Calcium (Total), Serum  7.8   Phosphorus, Serum  8.3   Anion Gap 16   Osmolality (calc) 307   eGFR (African American)  12   eGFR (Non-African American)  11 (eGFR values <49m/min/1.73 m2 may be an indication of chronic kidney disease (CKD). Calculated eGFR is useful in patients with stable renal function. The eGFR calculation will not be reliable in acutely ill patients when serum creatinine is changing rapidly. It is not useful in  patients on dialysis. The eGFR calculation may not be applicable to patients at the low and high extremes of body sizes, pregnant women, and vegetarians.)  Misc Urine Chem:  29-Jul-13 11:22    Creatinine, Serum (CRCL)  4.68   Creatinine, Urine 69.4   Total Volume, Urine 500   Collection Hours, Urine 24   Creatinine Clearance Rate  5 (Result(s) reported on 04 Apr 2012 at 12:22PM.)  Routine Hem:  29-Jul-13 05:42    WBC (CBC)  13.9   RBC (CBC)  3.63   Hemoglobin (CBC)  11.5   Hematocrit (CBC)  35.7   Platelet Count (CBC)  146   MCV 98   MCH 31.8   MCHC 32.4   RDW  16.4   Neutrophil % 70.7  Lymphocyte % 21.0   Monocyte % 6.7   Eosinophil % 1.5   Basophil % 0.1   Neutrophil #  9.8   Lymphocyte # 2.9   Monocyte # 0.9   Eosinophil # 0.2   Basophil # 0.0 (Result(s) reported on 04 Apr 2012 at 07:27AM.)     Impression 1.Foot ulcer- clearly he has significant ASO and will need to have revascularization however, given the renal failure angiography is a problem.  However, if the patient elects to proceed with dialysis that issue is mute. 2. Renal failure if the family chooses to begin dialysis then access will be obtained, likely with a catheter initially 3  Atrial fibrillation- rate up slightly, and will follow; not anticoagulation candidate due to h/o gi bleed, renal failure 4.  Hypertension. continue on metoprolol 5. Gastroesophageal reflux disease. continue ppi 6.  DM- covering with SSI 7. COPD- Stable    Plan level 4   Electronic Signatures: Hortencia Pilar (MD)  (Signed 29-Jul-13 19:00)  Authored: General Aspect/Present Illness, Home Medications, Allergies, History and Physical Exam, Vital Signs, Labs, Impression/Plan   Last Updated: 29-Jul-13 19:00 by Hortencia Pilar (MD)

## 2014-12-25 NOTE — Discharge Summary (Signed)
PATIENT NAME:  Hayden Cisneros, Hayden Cisneros MR#:  [id number] DATE OF BIRTH:  02-05-1926  DATE OF ADMISSION:  04/01/2012 DATE OF DISCHARGE:  04/06/2012  FINAL DIAGNOSES:  1. Acute on chronic renal failure.  2. Acute on chronic diastolic left-sided congestive heart failure.  3. Diabetes mellitus.  4. Hypertension.  5. Chronic obstructive pulmonary disease with acute exacerbation.  6. Hyperlipidemia.  7. Glaucoma.  8. Benign prostatic hypertrophy.  9. Gastroesophageal reflux disease.  10. Atrial fibrillation with rapid rate.  11. History of ischemic colitis.   HISTORY AND PHYSICAL: Please see dictated admission history and physical.   SUMMARY OF HOSPITAL COURSE: The patient was admitted with evidence of fluid overload and acute renal failure. His creatinine had deteriorated to the point that his GFR was well below 15. Nephrology saw the patient. He had significant acidosis, he was started on bicarbonate, but it became clear as his creatinine continued to deteriorate that the options were either going to be dialysis or comfort measures. This was discussed at length with the patient and his family members, in addition palliative care was involved. After giving it good thought, the decision on the patient's part was that he did not want to initiate dialysis and that he would prefer to go to a hospice home. This is a very reasonable decision in this elderly gentleman whose quality of life has deteriorated, and at this time the plan will be made for him to go to hospice home in stable condition. His diet will be as tolerated. His activity will be as tolerated as well. Due to his terminal nature, we would not ask him to be weighed daily or to be monitored for heart failure symptoms other than to provide comfort measures, per the patient's request. Furthermore, we would not add ACE inhibitors or angiotensin receptor blockers secondary to his renal failure.   He did have evidence of stage II decubitus on his right heel  and this was debrided by podiatry while he was here. Vascular surgery saw him as well, and they felt this wound would not heal without revascularization, which is obviously not an option in this patient with renal failure.   DISCHARGE MEDICATIONS:  1. Roxanol 20 mg/mL, 5 mg p.o. every four hours p.r.n. severe pain.  2. Lorazepam 0.5 to 1 mg p.o. or sublingually every 2 to 4 hours p.r.n. agitation or anxiety.  3. Ranitidine 150 mg p.o. twice a day. 4. ABHR suppository rectally every 4 to 6 hours p.r.n. nausea or vomiting.  5. Toprol-XL 50 mg p.o. twice a day. 6. Colace 100 mg p.o. twice a day. 7. Alphagan 0.15% drops one drop to each eye twice a day. 8. Omeprazole 20 mg p.o. daily.  9. Xalatan 0.005% one drop to each eye at bedtime.  10. DuoNeb SVN every 6 hours as needed for shortness of breath.  11. Artificial Tears ophthalmic solution one drop to each eye twice a day p.r.n. dry eye.   CODE STATUS: The patient is DO NOT RESUSCITATE. An out of facility DNR order will be sent with him to the facility. ____________________________ Lynnea FerrierBert J. Klein III, MD bjk:slb D: 04/06/2012 07:48:27 ET T: 04/06/2012 08:39:10 ET JOB#: [id number]  cc: Lynnea FerrierBert J. Klein III, MD, <Dictator> Daniel NonesBERT KLEIN MD ELECTRONICALLY SIGNED 04/08/2012 13:40

## 2014-12-30 NOTE — Discharge Summary (Signed)
PATIENT NAME:  Hayden Cisneros, Hayden Cisneros MR#:  [id number] DATE OF BIRTH:  03/19/1926  DATE OF ADMISSION:  10/13/2011 DATE OF DISCHARGE:  10/22/2011  DISPOSITION: To [facility].   DISCHARGE DIAGNOSES:  1. Acute on chronic respiratory failure secondary to congestive heart failure exacerbation. No acute diastolic heart failure.  2. Chronic obstructive pulmonary disease exacerbation with community-acquired pneumonia.  3. Rapid atrial fibrillation with rapid ventricular response, not a candidate for anticoagulation because of history of gastrointestinal bleed.  4. Chronic kidney disease stage IV and deconditioning.  5. Diabetes mellitus, type 2.   DISCHARGE MEDICATIONS: 1. Spiriva 18 micrograms inhalation daily.  2. Tandem. 3. Prilosec 20 mg p.o. daily.  4. Lasix 40 mg once a day. 5. Glipizide 5 mg p.o. b.i.d.  6. Welchol 625 mg 3 tablets 2 times daily. 7. Aldactone 25 mg p.o. daily.  8. Aspirin 81 mg p.o. daily.  9. Colace 100 milligrams p.o. b.i.d.  10. Ativan 0.5 mg p.o. b.i.d. as needed.  11. Advair Diskus 250/50, one puff b.i.d.  12. Alphagan eyedrops into affected eye 2 times daily.  13. Metoprolol 50 mg p.o. b.i.d.   NEW MEDICINE:  1. Prednisone 60 mg daily, taper it over a few days to 10 mg daily and then stop.  2. Augmentin 875 mg p.o. b.i.d. for seven days.  3. Glipizide increased to 5 mg p.o. b.i.d.  4. He can also continue nebulizer, Albuterol with that, Xopenex and Atrovent every six hours for wheezing and also oxygen around 2.5 liters through a nasal cannula.   DIET: Low sodium, ADA diet.   CONSULTATIONS:  1. Cardiology consult with Dr. Gwen Pounds. 2. Physical therapy consult.   HOSPITAL COURSE:  1. Acute on chronic respiratory failure. The patient is an 81 year old male patient who is from [facility] who came in because of cough and trouble breathing and the patient getting tired and also, saturations were around 88 to 85% on 5 liters of nasal cannula. He usually uses 2  liters in nursing home. The patient's chest x-ray on 02/05 showed lungs are hyperinflated, bilateral atelectasis and pneumonia. The patient's ABG showed pH of 7.17, CO2 of 64 and 59 of p02. The patient is started on BiPAP on admission along with antibiotics, steroids, nebulizers and he also was found to have atrial fibrillation with RVR and started on digoxin.  2. Hypoxic respiratory failure. The patient was also seen by cardiologist, Dr. Lady Gary. The patient initially was put on BiPAP because of respiratory acidosis, but he did not tolerate it and he was continued on Lasix, gentle diuresis and also continued on beta blockers for heart rate control. He had an echocardiogram done in September last year which showed ejection fraction of more than 55%. The echocardiogram is not repeated. The patient was started on Levaquin for pneumonia along with steroids. The patient's oxygenation is much better and he is able to be down to 2 liters now at this time. Oxygen saturation is around 96% on 2 liters today. Repeat x-ray on 02/12 showed there is improvement in the appearance and pulmonary vascularity is not congested and also no focal infiltrate. The patient has been afebrile. The patient is to continue to finish the course of antibiotic because he still has some cough. So, he will be continuing Augmentin along with nebulizers and oxygen. He can continue Spiriva and tapering course of prednisone for his chronic obstructive pulmonary disease.  3. The patient has history of congestive heart failure, as I mentioned before, which is diastolic. The  patient is on Lasix and Aldactone. The patient's BUN and creatinine actually went up to 3 on 01/11. The patient Lasix and Aldactone were held for a couple of days because of worsening of kidney function along with BUN being high, up to 110, but his kidney function has been almost back to baseline with creatinine this morning of 2.82. The patient has no trouble breathing and lungs are  just diminished breath sounds bilaterally, but no wheezing, so he can continue the Lasix and Aldactone, but he needs to have repeat BMP done in two to three days just to make sure, however, creatinine is doing. The patient can follow up with cardiologist, Dr. Lady GaryFath or Dr. Gwen PoundsKowalski, as an outpatient as scheduled.  4. The patient has diabetes. His sugars were elevated up to 369 because of steroids, so he is taking glipizide 5 mg so he can continue the glipizide 5 milligrams at b.i.d. dose and check the sugars as per the nursing home protocol.  5. Deconditioning. The patient was seen by physical therapist and recommended short-term rehab for some time before [facility]. The patient is doing better with PT every day, but he still has weakness and fatigues very quickly and has poor gait and he will definitely have to improve his strength and gait training, so physical therapy recommended short-term rehab and family accepted [facility] bed [facility].  6. Regarding atrial fibrillation with rapid atrial fib, he is on metoprolol for heart rate control, not a candidate for anticoagulation because of history of gastrointestinal bleed and and he can continue aspirin 81 mg daily.   LAB DATA DURING THIS HOSPITAL STAY: WBC on admission 12.4, hemoglobin 12.1, hematocrit 37.1, platelets 144. Electrolytes on admission: Sodium 135, potassium 4.3, chloride 101, bicarbonate 27, BUN 60, creatinine 3 and glucose 136. Chest x-ray on 02/05 showed hypoinflation, bibasilar atelectasis. White count normalized to 9.7 on 02/07 and BUN and creatinine also has been back to baseline at 2.8 this morning.   The patient remains stable hemodynamically.   CODE STATUS: DO NOT RESUSCITATE. Discussed with the family member at bedside.   TOTAL TIME SPENT ON DISCHARGE PREPARATION: More than 30 minutes.   ____________________________ Katha HammingSnehalatha Siddh Vandeventer, MD sk:ap D: 10/22/2011 10:07:58 ET T: 10/22/2011 12:08:38  ET JOB#: [id number]  cc: Katha HammingSnehalatha Nelle Sayed, MD, <Dictator> Lamar BlinksBruce J. Kowalski, MD Katha HammingSNEHALATHA Kyleigh Nannini MD ELECTRONICALLY SIGNED 10/29/2011 9:16

## 2014-12-30 NOTE — H&P (Signed)
PATIENT NAME:  Hayden Cisneros, [name] MR#:  [id number] DATE OF BIRTH:  1926-01-09  DATE OF ADMISSION:  10/13/2011  PRIMARY CARE PHYSICIAN: Dr. Silver HugueninAileen Miller CARDIOLOGIST: Dr. Gwen PoundsKowalski  CHIEF COMPLAINT: Increasing shortness of breath for two days.   HISTORY OF PRESENT ILLNESS: Mr. Hayden Cisneros is a pleasant 81 year old Caucasian gentleman [facility] comes in with increasing shortness of breath and tachycardia. Patient was started on Z-Pak 10[facility]12/2011 [facility] for cough and congestion. His symptoms got worse. He usually wears 2 liters of nasal cannula oxygen during nighttime. Patient said he started getting tired, fatigued because of shortness of breath, could not catch much of his breath during his routine chores, brought to the Emergency Room, was found to be in acute on chronic rapid atrial fibrillation, heart rate in the 170s, hypoxic respiratory failure secondary to suspected congestive heart failure, which appears to be systolic. He has had an echo done in September 2012 which showed ejection fraction of 55%. Patient received IV diltiazem 15 mg. His heart rate came down into the 100s. however, his blood pressure dropped a little bit hence we are going to try a little bit of IV digoxin. He received 40 mg of IV Lasix. Patient is going to be tried on BiPAP at present. He is being admitted for further evaluation and management.  PAST MEDICAL HISTORY:  1. Glaucoma.  2. Chronic obstructive pulmonary disease.  3. Congestive heart failure, diastolic.  4. Osteoporosis.  5. Chronic renal failure, baseline around 2.17.  6. Reflux esophagitis.  7. History of kidney stones.  8. Hypertension.  9. Hypercholesterolemia.  10. Type 2 diabetes.  11. Cataract extraction. 12. Inguinal hernia repair.  13. History of chronic atrial fibrillation. Previously on Pradaxa, now only on aspirin.  14. Benign prostatic hypertrophy.   ALLERGIES: Statins.   SOCIAL HISTORY: [facility]. Continues to use tobacco in the form of pipe smoking which he has done for the last 50 years. He has no alcohol abuse or history of drug use.   FAMILY HISTORY: Notable for cerebrovascular accident in father.    REVIEW OF SYSTEMS: CONSTITUTIONAL: Positive for fatigue, weakness. No fever. EYES: No blurred or double vision. Positive for glaucoma. ENT: No tinnitus, ear pain, hearing loss. RESPIRATORY: Positive for shortness of breath. CARDIOVASCULAR: Positive for arrhythmia and shortness of breath. GASTROINTESTINAL: No nausea, vomiting, diarrhea, abdominal pain. GENITOURINARY: No dysuria, hematuria. ENDOCRINE: No polyuria or nocturia. HEMATOLOGY: No anemia or easy bruising. SKIN: No acne, rash. MUSCULOSKELETAL: Positive for arthritis. NEUROLOGIC: No cerebrovascular accident, transient ischemic attack. PSYCH: No anxiety or depression. All other systems reviewed and negative.   MEDICATIONS:  1. Tylenol 325, 2 pills q.4 hours as needed.   2. Flonase 50 mcg inhalation two sprays each nostril once a day as needed.  3. Tessalon Perles 200 mg capsule take 1 capsule every eight hours as needed.  4. Zithromax 250 mg for four more days, last dose will be on 02/07.  5. TED hose.   6. Metoprolol 50 mg b.i.d.  7. Alphagan 0.2% eye drops one drop both eyes twice a day.  8. Calcium with vitamin D 1 tablet b.i.d.  9. Advair 250/50, 1 puff b.i.d.  10. Colace 100 mg b.i.d.  11. Lorazepam 0.5 mg p.o. b.i.d.  12. WelChol 625 mg 3 tablets b.i.d.  13. Xalatan 0.005% one drop each eye daily in the evening.  14. Senokot S 2 tablets 4 times a day for constipation.  15. Aspirin 81 mg daily.  16. Spironolactone 25 mg daily.  17. Glipizide 5 mg daily.  18. Lasix 40 mg daily.  19. Omeprazole 20 mg daily.  20. Tandem capsule p.o. daily.  21. Spiriva HandiHaler daily.  CODE STATUS: DO NOT RESUSCITATE per DO NOT RESUSCITATE request form which he carries from assisted living.   PHYSICAL EXAMINATION:   GENERAL: Patient is awake, alert, mild to moderate distress due to shortness of breath.   VITAL SIGNS: Temperature 98.1, pulse 112 to 120 irregularly irregular, blood pressure 102/59, sats 85% to 88% on 5 liters nasal cannula oxygen.    HEENT: Atraumatic, normocephalic. Pupils are equal, round, and reactive to light and accommodation. Extraocular movements are intact. Oral mucosa is moist.   NECK: Supple. No JVD. No carotid bruit.   RESPIRATORY: Bibasilar rales present. No wheezing. Mild to moderate respiratory distress. No use of accessory muscles.   CARDIOVASCULAR: Irregularly irregular heart rhythm. No murmur heard. PMI not lateralized. Chest nontender.   EXTREMITIES: Good pedal pulses, good femoral pulses. Trace lower extremity edema.   ABDOMEN: Soft, benign, nontender. No organomegaly. Positive bowel sounds.   NEUROLOGIC: Grossly intact cranial nerves II through XII. No motor or sensory deficit.   LABORATORY, DIAGNOSTIC AND RADIOLOGICAL DATA: EKG shows rapid atrial fibrillation. pH 7.17, pCO2 64, pO2 59 with FiO2 of 91% on 5 liters nasal cannula oxygen. Cardiac enzymes negative. Glucose 136, BUN 60, creatinine 3, sodium 135. Rest of the chemistry is normal. Albumin 3.1. Hemoglobin and hematocrit 12.1 and 37.1. White count 12.4, platelet count 144. B-type natriuretic peptide 11,080.    Patient had echocardiogram in September 2012 with mild mitral valve regurgitation. Ejection fraction of 55%. Mild tricuspid regurgitation.    ASSESSMENT: 81 year old Mr. Hayden Cisneros with: 1. Acute hypoxic respiratory failure due to congestive heart failure, acute diastolic. Echo in September 2012 shows ejection fraction of 55% with mild MR and TR.  2. Rapid atrial fibrillation with rapid ventricular response in the setting of congestive heart failure. Patient was on Pradaxa in the past, now on aspirin.  3. Bilateral pleural effusion due to congestive heart failure.  4. Hypertension.  5. Gastroesophageal  reflux disease. 6. Acute on chronic, chronic kidney disease stage IV, baseline creatinine around 2.17.   PLAN:  1. Admit patient to Intensive Care Unit.  2. 2 grams sodium, ADA 1800 calorie diet.  3. DO NOT RESUSCITATE. 4. Will start patient on noninvasive BiPAP as he tolerates.  5. Lasix 20 mg t.i.d. Watch ins and outs, creatinine.  6. Continue all p.o. home medications except hold off on metoprolol since patient's blood pressure is on the lower side. Will give him IV digoxin 0.5 mg push STAT and then may need to do digoxin loading if heart rate does not improve.  7. Will hold off on nitroglycerin patch given hypotension.  8. Continue spironolactone and omeprazole.  9. Breathing treatments around-the-clock as needed.  10. Cardiology consultation with [facility] cardiology in the morning.  11. Cycle cardiac enzymes x3.  12. Further work-up according to patient's clinical course. Hospital admission plan was discussed with patient, patient's family members. Patient is a NO CODE, DO NOT RESUSCITATE.   CRITICAL TIME SPENT: 50 minutes.  ____________________________ Wylie Hail Allena Katz, MD sap:cms D: 10/13/2011 21:37:06 ET T: 10/14/2011 06:28:15 ET JOB#: [id number]  cc: Thermon Zulauf A. Allena Katz, MD, <Dictator> Yetta Flock, MD Lamar Blinks, MD Willow Ora MD ELECTRONICALLY SIGNED 10/15/2011 7:41

## 2014-12-30 NOTE — Consult Note (Signed)
General Aspect 81 year old male with history of preserved left ventricular function, hypertension, hyperlipidemia and diabetes as well as atrial fibrillation who presents for evaluation of increasing shortness of breath.  [facility] and was noted by staff to have increasing shortness of breath.  On presentation to the emergency room he was noted to be in atrial fibrillation rapid ventricular response.  Patient has a history of atrial fibrillation with also a history of a GI bleed it has been felt by GI to not be a candidate for chronic anticoagulation.  He is ruled out for myocardial infarction.  He was placed on BiPAP last PM and noted difficulty breathing with this causing a headache.  He is currently hemodynamically stable and denies chest pain or shortness of breath.  He has been compliant with his medications.   Physical Exam:   GEN obese    HEENT PERRL    NECK supple    RESP no use of accessory muscles  rhonchi    CARD Irregular rate and rhythm  Murmur    Murmur Systolic    Systolic Murmur axilla    ABD denies tenderness  adominal Mass  no Adominal Mass    LYMPH negative neck    EXTR negative cyanosis/clubbing, positive edema    SKIN normal to palpation    NEURO cranial nerves intact, motor/sensory function intact    PSYCH A+O to time, place, person   Review of Systems:   Subjective/Chief Complaint shortness of breath with rapid heart rate    General: No Complaints    Skin: No Complaints    ENT: No Complaints    Eyes: No Complaints    Neck: No Complaints    Respiratory: Short of breath    Cardiovascular: Tightness    Gastrointestinal: No Complaints    Genitourinary: No Complaints    Musculoskeletal: No Complaints    Neurologic: No Complaints    Hematologic: No Complaints    Endocrine: No Complaints    Psychiatric: No Complaints    Review of Systems: All other systems were reviewed and found to be negative     Medications/Allergies Reviewed Medications/Allergies reviewed     Glaucoma:    COPD:    Benign Hyp with CHF:    Osteoporosis:    Myopathy:    Renal Insufficiency:    Reflux Esophagitis:    Kidney Stones:    HTN:    Hypercholesterolemia:    Diabetes Mellitus, Type II (NIDD):    Cataract Extraction: BOTH EYES   Inguinal Hernia Repair:    Lithotripsy:        Admit Diagnosis:   CHF: 14-Oct-2011, Active, CHF      Admit Reason:   Congestive heart failure, unspecified: (428.0) 13-Oct-2011, Active, ICD9, Congestive heart failure, unspecified, Auto-generated by MLM Based on Admission Order      Chronic Problem:   Anemia: (285.9) Active, ICD9, Unspecified anemia   Atrial fibrillation: (427.31) Active, ICD9, Atrial fibrillation   Atrial fibrillation: (427.31) Active, ICD9, Atrial fibrillation   COPD exacerbation: (491.21) Active, ICD9, Obstructive chronic bronchitis with exacerbation  Home Medications: Medication Instructions Status  Spiriva 18 mcg inhalation capsule 1  inhaled once a day  Active  Tandem   once a day  Active  Prilosec 20 mg oral delayed release capsule 1  orally once a day  Active  furosemide 40 mg oral tablet 1  orally once a day  Active  glipiZIDE 5 mg oral tablet 1  orally once  a day in the mornig Active  Welchol 625 mg oral tablet 3 tab(s) orally 2 times a day  Active  spironolactone 25 mg oral tablet 1 tab(s) orally once a day  Active  aspirin 81 mg oral tablet, chewable 1  orally once a day  Active  Colace 100 mg oral capsule 1  orally 2 times a day  Active  Ativan 0.5 mg oral tablet 1  orally 2 times a day, As Needed- for Bladder Spasms , for Anxiety, Nervousness  Active  Artificial Tears ophthalmic solution 1 gtt drops to each affected eye 2 times a day as needed   Active  Advair Diskus 250 mcg-50 mcg inhalation powder 1 puff(s) inhaled 2 times a day Active  Alphagan 1 drop(s) to each affected eye 2 times a day Active  Calcium 600+D 600  mg-200 intl units oral tablet 1 tab(s) orally 2 times a day Active  metoprolol tartrate 50 mg oral tablet 1 tab(s) orally 2 times a day Active   EKG:   Interpretation atrial fibrillation    Simvastatin: Unknown  Statins: Unknown    Impression 81 year old male with history of atrial fibrillation treated with rate control but not anticoagulation secondary to history of GI bleeding who was admitted with atrial fibrillation rapid ventricular response as well as respiratory failure.  He is ruled out for myocardial infarction.  Chest x-ray reveals no significant volume overload.  Echocardiogram revealed an ejection fraction greater than 40%.  He does have mitral regurgitation.  He denies chest pain but does have increasing shortness of breath.  He has been compliant with medications.  He currently appears hemodynamically stable.  patient has chronic kidney disease with serum creatinine of 3.0.  Etiology of symptoms is likely multi-factorial to include atrial fibrillation, renal insufficiency as well as respiratory failure.    Plan 1.  Continue with current medications 2.  Care for with diuresis follow renal function 3.  Continue with BiPAP 4.  Continued to defer chronic anticoagulation therapy due to the GI bleed 5.  Increased activity in a.m. and follow for evidence of symptoms.   Electronic Signatures: Dalia Heading (MD)  (Signed 06-Feb-13 21:46)  Authored: General Aspect/Present Illness, History and Physical Exam, Review of System, Past Medical History, Health Issues, Home Medications, EKG , Allergies, Impression/Plan   Last Updated: 06-Feb-13 21:46 by Dalia Heading (MD)

## 2014-12-30 NOTE — Discharge Summary (Signed)
PATIENT NAME:  Lorin PicketVINES, Gianlucas B MR#:  [id number] DATE OF BIRTH:  [birth year]  DATE OF ADMISSION:  10/13/2011 DATE OF DISCHARGE:  10/22/2011  ADDENDUM  DISCHARGE DIAGNOSES:  Acute on chronic respiratory failure secondary to congestive heart failure exacerbation, which is acute on chronic diastolic heart failure with ejection fraction of 55% on 05/2011 echo.   The patient is on Aldactone and also Lasix as congestive heart failure protocol and also on beta blocker which is metoprolol.    ____________________________ Katha HammingSnehalatha Finch Costanzo, MD sk:ap D: 10/23/2011 07:55:33 ET T: 10/23/2011 14:01:06 ET JOB#: [id number]  cc: Katha HammingSnehalatha Christopherjohn Schiele, MD, <Dictator> Katha HammingSNEHALATHA Jacquline Terrill MD ELECTRONICALLY SIGNED 10/29/2011 9:17
